# Patient Record
Sex: Male | Born: 1955
Health system: Southern US, Community
[De-identification: ages and names within clinical notes are randomized; demographics above are authoritative.]

## PROBLEM LIST (undated history)

## (undated) ENCOUNTER — Emergency Department (HOSPITAL_BASED_OUTPATIENT_CLINIC_OR_DEPARTMENT_OTHER): Discharge status: Against Medical Advice | Payer: 59

## (undated) DIAGNOSIS — B019 Varicella without complication: Secondary | ICD-10-CM

## (undated) DIAGNOSIS — K5090 Crohn's disease, unspecified, without complications: Secondary | ICD-10-CM

## (undated) DIAGNOSIS — T8859XA Other complications of anesthesia, initial encounter: Secondary | ICD-10-CM

## (undated) DIAGNOSIS — M7551 Bursitis of right shoulder: Secondary | ICD-10-CM

## (undated) DIAGNOSIS — M1990 Unspecified osteoarthritis, unspecified site: Secondary | ICD-10-CM

## (undated) DIAGNOSIS — I341 Nonrheumatic mitral (valve) prolapse: Secondary | ICD-10-CM

## (undated) DIAGNOSIS — L309 Dermatitis, unspecified: Secondary | ICD-10-CM

## (undated) DIAGNOSIS — M545 Low back pain, unspecified: Secondary | ICD-10-CM

## (undated) DIAGNOSIS — G8929 Other chronic pain: Secondary | ICD-10-CM

## (undated) DIAGNOSIS — T4145XA Adverse effect of unspecified anesthetic, initial encounter: Secondary | ICD-10-CM

## (undated) DIAGNOSIS — N200 Calculus of kidney: Secondary | ICD-10-CM

## (undated) DIAGNOSIS — K523 Indeterminate colitis: Secondary | ICD-10-CM

## (undated) DIAGNOSIS — R011 Cardiac murmur, unspecified: Secondary | ICD-10-CM

## (undated) DIAGNOSIS — G43909 Migraine, unspecified, not intractable, without status migrainosus: Secondary | ICD-10-CM

## (undated) DIAGNOSIS — M792 Neuralgia and neuritis, unspecified: Secondary | ICD-10-CM

## (undated) HISTORY — DX: Dermatitis, unspecified: L30.9

## (undated) HISTORY — PX: TONSILECTOMY, ADENOIDECTOMY, BILATERAL MYRINGOTOMY AND TUBES: SHX2538

## (undated) HISTORY — DX: Nonrheumatic mitral (valve) prolapse: I34.1

## (undated) HISTORY — DX: Neuralgia and neuritis, unspecified: M79.2

## (undated) HISTORY — DX: Migraine, unspecified, not intractable, without status migrainosus: G43.909

## (undated) HISTORY — DX: Cardiac murmur, unspecified: R01.1

## (undated) HISTORY — DX: Crohn's disease, unspecified, without complications: K50.90

## (undated) HISTORY — DX: Low back pain, unspecified: M54.50

## (undated) HISTORY — DX: Other chronic pain: G89.29

## (undated) HISTORY — DX: Low back pain: M54.5

## (undated) HISTORY — DX: Varicella without complication: B01.9

## (undated) HISTORY — DX: Indeterminate colitis: K52.3

## (undated) HISTORY — PX: NO PAST SURGERIES: SHX2092

## (undated) HISTORY — PX: APPENDECTOMY: SHX54

## (undated) HISTORY — PX: BELPHAROPTOSIS REPAIR: SHX369

## (undated) HISTORY — DX: Calculus of kidney: N20.0

## (undated) HISTORY — PX: LUMBAR DISC SURGERY: SHX700

## (undated) HISTORY — DX: Bursitis of right shoulder: M75.51

## (undated) HISTORY — PX: SHOULDER SURGERY: SHX246

---

## 2011-12-11 ENCOUNTER — Ambulatory Visit (INDEPENDENT_AMBULATORY_CARE_PROVIDER_SITE_OTHER): Payer: 59 | Admitting: Internal Medicine

## 2011-12-11 ENCOUNTER — Encounter: Payer: Self-pay | Admitting: Internal Medicine

## 2011-12-11 VITALS — BP 110/70 | HR 68 | Temp 98.2°F | Resp 14 | Ht 74.0 in | Wt 148.0 lb

## 2011-12-11 DIAGNOSIS — I341 Nonrheumatic mitral (valve) prolapse: Secondary | ICD-10-CM | POA: Insufficient documentation

## 2011-12-11 DIAGNOSIS — I059 Rheumatic mitral valve disease, unspecified: Secondary | ICD-10-CM

## 2011-12-11 DIAGNOSIS — M67919 Unspecified disorder of synovium and tendon, unspecified shoulder: Secondary | ICD-10-CM

## 2011-12-11 DIAGNOSIS — Z0000 Encounter for general adult medical examination without abnormal findings: Secondary | ICD-10-CM

## 2011-12-11 DIAGNOSIS — M7551 Bursitis of right shoulder: Secondary | ICD-10-CM

## 2011-12-11 MED ORDER — METHYLPREDNISOLONE ACETATE 40 MG/ML IJ SUSP: 40.0000 mg | Freq: Once | INTRAMUSCULAR | Status: DC

## 2011-12-11 NOTE — Patient Instructions (Signed)
Back Exercises Back exercises help treat and prevent back injuries. The goal of back exercises is to increase the strength of your abdominal and back muscles and the flexibility of your back. These exercises should be started when you no longer have back pain. Back exercises include:  Pelvic Tilt. Lie on your back with your knees bent. Tilt your pelvis until the lower part of your back is against the floor. Hold this position 5 to 10 sec and repeat 5 to 10 times.   Knee to Chest. Pull first 1 knee up against your chest and hold for 20 to 30 seconds, repeat this with the other knee, and then both knees. This may be done with the other leg straight or bent, whichever feels better.   Sit-Ups or Curl-Ups. Bend your knees 90 degrees. Start with tilting your pelvis, and do a partial, slow sit-up, lifting your trunk only 30 to 45 degrees off the floor. Take at least 2 to 3 seconds for each sit-up. Do not do sit-ups with your knees out straight. If partial sit-ups are difficult, simply do the above but with only tightening your abdominal muscles and holding it as directed.   Hip-Lift. Lie on your back with your knees flexed 90 degrees. Push down with your feet and shoulders as you raise your hips a couple inches off the floor; hold for 10 seconds, repeat 5 to 10 times.   Back arches. Lie on your stomach, propping yourself up on bent elbows. Slowly press on your hands, causing an arch in your low back. Repeat 3 to 5 times. Any initial stiffness and discomfort should lessen with repetition over time.   Shoulder-Lifts. Lie face down with arms beside your body. Keep hips and torso pressed to floor as you slowly lift your head and shoulders off the floor.  Do not overdo your exercises, especially in the beginning. Exercises may cause you some mild back discomfort which lasts for a few minutes; however, if the pain is more severe, or lasts for more than 15 minutes, do not continue exercises until you see your  caregiver. Improvement with exercise therapy for back problems is slow.  See your caregivers for assistance with developing a proper back exercise program. Document Released: 08/29/2004 Document Revised: 07/11/2011 Document Reviewed: 07/22/2005 [employer] Patient Information 2012 [employer]. 

## 2011-12-11 NOTE — Progress Notes (Signed)
Subjective:    Patient ID: Jared Gilbert, male    DOB: Sep 09, 1955, 56 y.o.   MRN: [id number]  HPI  This is a 56 year old white male who presents to establish long-term medical care.  His current medical problems include mitral valve prolapse he has also a history of bursitis in his right shoulder.  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  He has no current health issues he feels well he does not smoke he is on no maintenance medications other than Restasis eyedrops and topical therapy for seborrhea.  He had a health maintenance examination about one year ago  Review of Systems  Constitutional: Negative for fever and fatigue.  HENT: Negative for hearing loss, congestion, neck pain and postnasal drip.   Eyes: Negative for discharge, redness and visual disturbance.  Respiratory: Negative for cough, shortness of breath and wheezing.   Cardiovascular: Negative for leg swelling.  Gastrointestinal: Negative for abdominal pain, constipation and abdominal distention.  Genitourinary: Negative for urgency and frequency.  Musculoskeletal: Negative for joint swelling and arthralgias.  Skin: Negative for color change and rash.  Neurological: Negative for weakness and light-headedness.  Hematological: Negative for adenopathy.  Psychiatric/Behavioral: Negative for behavioral problems.   Past Medical History  Diagnosis Date  . Blood in stool   . Chicken pox   . GERD (gastroesophageal reflux disease)   . Heart murmur   . Kidney stones   . Migraines   . Eczema   . Blepharitis   . MVP (mitral valve prolapse)     on echo about 10 years ago  2002  . Shoulder bursitis, right     chronic  . Neuritis of upper extremity     right upper extremities  . Chronic lumbar pain     History   Social History  . Marital Status: Married    Spouse Name: N/A    Number of Children: N/A  . Years of Education: N/A   Occupational History  . md     Social History Main Topics  . Smoking status: Never Smoker   . Smokeless tobacco: Not on file  . Alcohol Use: Yes  . Drug Use: No  . Sexually Active: Yes   Other Topics Concern  . Not on file   Social History Narrative  . No narrative on file    Past Surgical History  Procedure Date  . Appendectomy   . Tonsilectomy, adenoidectomy, bilateral myringotomy and tubes   . Belpharoptosis repair     Family History  Problem Relation Age of Onset  . Hyperlipidemia Mother   . Hypertension Mother   . Stroke Father   . Atrial fibrillation Father     Allergies  Allergen Reactions  . Contrast Media (Iodinated Diagnostic Agents)     As a child has reaction    No current outpatient prescriptions on file prior to visit.    BP 110/70  Pulse 68  Temp 98.2 F (36.8 C)  Resp 14  Wt 148 lb (67.132 kg)       Objective:   Physical Exam  Nursing note and vitals reviewed. Constitutional: He appears well-developed and well-nourished.  HENT:  Head: Normocephalic and atraumatic.  Eyes: Conjunctivae are normal. Pupils are equal, round, and reactive to light.  Neck: Normal range of motion. Neck supple.  Cardiovascular: Normal rate and regular rhythm.   Murmur heard. Pulmonary/Chest: Effort normal and breath sounds normal.  Abdominal: Soft. Bowel sounds are normal.  Assessment & Plan:   Informed consent obtained and the patient's right shoulder was prepped with betadine. Local anesthesia was obtained with topical spray. Then 40 mg of Depo-Medrol and 1/2 cc of lidocaine was injected into the joint space. The patient tolerated the procedure without complications. Post injection care discussed with patient.  Patient was set up for complete physical examination.  Patient's mitral valve prolapse is stable and I would not order an echocardiogram at this time unless his changes no murmur or there is significant symptoms of reflux  Otherwise recommended complete physical  examination aspirin a day Kidney exercise

## 2012-03-06 ENCOUNTER — Other Ambulatory Visit (INDEPENDENT_AMBULATORY_CARE_PROVIDER_SITE_OTHER): Payer: 59

## 2012-03-06 DIAGNOSIS — Z0000 Encounter for general adult medical examination without abnormal findings: Secondary | ICD-10-CM

## 2012-03-06 LAB — HEPATIC FUNCTION PANEL
ALT: 18 U/L (ref 0–53)
AST: 24 U/L (ref 0–37)
Albumin: 3.9 g/dL (ref 3.5–5.2)

## 2012-03-06 LAB — CBC WITH DIFFERENTIAL/PLATELET
Basophils Relative: 0.6 % (ref 0.0–3.0)
Eosinophils Relative: 1.9 % (ref 0.0–5.0)
HCT: 40.1 % (ref 39.0–52.0)
Hemoglobin: 13.1 g/dL (ref 13.0–17.0)
Lymphs Abs: 1.4 10*3/uL (ref 0.7–4.0)
MCV: 97.5 fl (ref 78.0–100.0)
Monocytes Absolute: 0.5 10*3/uL (ref 0.1–1.0)
Neutro Abs: 4 10*3/uL (ref 1.4–7.7)
RBC: 4.11 Mil/uL — ABNORMAL LOW (ref 4.22–5.81)
WBC: 5.9 10*3/uL (ref 4.5–10.5)

## 2012-03-06 LAB — POCT URINALYSIS DIPSTICK
Glucose, UA: NEGATIVE
Ketones, UA: NEGATIVE
Spec Grav, UA: >=1.03
Urobilinogen, UA: 0.2

## 2012-03-06 LAB — BASIC METABOLIC PANEL
BUN: 14 mg/dL (ref 6–23)
Chloride: 105 mEq/L (ref 96–112)
Creatinine, Ser: 0.9 mg/dL (ref 0.4–1.5)
GFR: 89.69 mL/min (ref >60.00)
Glucose, Bld: 88 mg/dL (ref 70–99)
Potassium: 4.1 mEq/L (ref 3.5–5.1)

## 2012-03-06 LAB — PSA: PSA: 1.5 ng/mL (ref 0.10–4.00)

## 2012-03-06 LAB — LIPID PANEL: Cholesterol: 159 mg/dL (ref 0–200)

## 2012-03-16 ENCOUNTER — Encounter: Payer: Self-pay | Admitting: Internal Medicine

## 2012-03-16 ENCOUNTER — Ambulatory Visit (INDEPENDENT_AMBULATORY_CARE_PROVIDER_SITE_OTHER): Payer: 59 | Admitting: Internal Medicine

## 2012-03-16 VITALS — BP 104/72 | HR 63 | Temp 97.9°F | Resp 14 | Ht 74.0 in | Wt 150.0 lb

## 2012-03-16 DIAGNOSIS — D229 Melanocytic nevi, unspecified: Secondary | ICD-10-CM

## 2012-03-16 DIAGNOSIS — D239 Other benign neoplasm of skin, unspecified: Secondary | ICD-10-CM

## 2012-03-16 DIAGNOSIS — M7551 Bursitis of right shoulder: Secondary | ICD-10-CM

## 2012-03-16 DIAGNOSIS — Z0000 Encounter for general adult medical examination without abnormal findings: Secondary | ICD-10-CM

## 2012-03-16 DIAGNOSIS — M719 Bursopathy, unspecified: Secondary | ICD-10-CM

## 2012-03-16 MED ORDER — METHYLPREDNISOLONE ACETATE 40 MG/ML IJ SUSP: 40.0000 mg | Freq: Once | INTRAMUSCULAR | Status: DC

## 2012-03-16 NOTE — Addendum Note (Signed)
Addended by: Willy Eddy on: 03/16/2012 05:42 PM   Modules accepted: Orders

## 2012-03-16 NOTE — Progress Notes (Signed)
Subjective:    Patient ID: Jared Gilbert, male    DOB: Apr 29, 1956, 56 y.o.   MRN: [id number]  HPI    Review of Systems  Constitutional: Negative for fever and fatigue.  HENT: Negative for hearing loss, congestion, neck pain and postnasal drip.   Eyes: Negative for discharge, redness and visual disturbance.  Respiratory: Negative for cough, shortness of breath and wheezing.   Cardiovascular: Negative for leg swelling.  Gastrointestinal: Negative for abdominal pain, constipation and abdominal distention.  Genitourinary: Negative for urgency and frequency.  Musculoskeletal: Negative for joint swelling and arthralgias.  Skin: Negative for color change and rash.       Changing mole on arm  Neurological: Negative for weakness and light-headedness.  Hematological: Negative for adenopathy.  Psychiatric/Behavioral: Negative for behavioral problems.   Past Medical History  Diagnosis Date  . Blood in stool   . Chicken pox   . GERD (gastroesophageal reflux disease)   . Heart murmur   . Kidney stones   . Migraines   . Eczema   . Blepharitis   . MVP (mitral valve prolapse)     on echo about 10 years ago  2002  . Shoulder bursitis, right     chronic  . Neuritis of upper extremity     right upper extremities  . Chronic lumbar pain     History   Social History  . Marital Status: Married    Spouse Name: N/A    Number of Children: N/A  . 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 Smoking status: Never Smoker   . Smokeless tobacco: Not on file  . Alcohol Use: Yes  . Drug Use: No  . Sexually Active: Yes   Other Topics Concern  . Not on file   Social History Narrative  . No narrative on file    Past Surgical History  Procedure Date  . Appendectomy   . Tonsilectomy, adenoidectomy, bilateral myringotomy and tubes   . Belpharoptosis repair     Family History  Problem Relation Age of Onset  . Hyperlipidemia Mother   .  Hypertension Mother   . Stroke Father   . Atrial fibrillation Father     Allergies  Allergen Reactions  . Contrast Media (Iodinated Diagnostic Agents)     As a child has reaction    Current Outpatient Prescriptions on File Prior to Visit  Medication Sig Dispense Refill  . cycloSPORINE (RESTASIS) 0.05 % ophthalmic emulsion 1 drop daily.      Marland Kitchen ketoconazole (NIZORAL) 2 % cream Apply topically 2 (two) times daily.      Marland Kitchen ketoconazole (NIZORAL) 2 % shampoo Apply topically once a week.       Current Facility-Administered Medications on File Prior to Visit  Medication Dose Route Frequency Provider Last Rate Last Dose  . methylPREDNISolone acetate (DEPO-MEDROL) injection 40 mg  40 mg Intra-articular Once Stacie Glaze, MD        BP 104/72  Pulse 63  Temp 97.9 F (36.6 C)  Resp 14  Ht 6\' 2"  (1.88 m)  Wt 150 lb (68.04 kg)  BMI 19.26 kg/m2       Objective:   Physical Exam  Constitutional: He is oriented to person, place, and time. He appears well-developed and well-nourished.  HENT:  Head: Normocephalic and atraumatic.  Eyes: Conjunctivae are normal. Pupils are equal, round, and reactive to light.  Neck: Normal range of motion. Neck  supple.  Cardiovascular: Normal rate and regular rhythm.   Pulmonary/Chest: Effort normal and breath sounds normal.  Abdominal: Soft. Bowel sounds are normal.  Genitourinary: Rectum normal and prostate normal.  Musculoskeletal: He exhibits tenderness.       Significantly range limitation of the right shoulder  Neurological: He is alert and oriented to person, place, and time.  Skin: Skin is warm and dry.       Suspicious mole seen on left forearm          Assessment & Plan:   Patient presents for yearly preventative medicine examination.   all immunizations and health maintenance protocols were reviewed with the patient and they are up to date with these protocols.   screening laboratory values were reviewed with the patient including  screening of hyperlipidemia PSA renal function and hepatic function.   There medications past medical history social history problem list and allergies were reviewed in detail.   Goals were established with regard to weight loss exercise diet in compliance with medications   Informed consent was given by the patient for a shave biopsy. The site was prepped with Betadine and using a 15 blade a 1 cm shave biopsy was obtained. The specimin was placed in preservative and sent for pathology. Hemostasis was achieved with a compression. Wound care was discussed with the patient. The patient was informed that it would be one to 2 weeks before the pathology will be interpreted. The lesion was seen on the left anterior shoulder   Informed consent obtained and the patient's right shoulder was prepped with betadine. Local anesthesia was obtained with topical spray. Then 40 mg of Depo-Medrol and 1/2 cc of lidocaine was injected into the joint space. The patient tolerated the procedure without complications. Post injection care discussed with patient.

## 2012-03-16 NOTE — Patient Instructions (Signed)
You have received a steroid injection into a joint space. It will take up to 48 hours before you notice a difference in the pain in the joint. For the next few hours keep ice on the site of the injection. Do not exert the injected joint for the next 24 hours.  

## 2012-05-15 ENCOUNTER — Telehealth: Payer: Self-pay | Admitting: Internal Medicine

## 2012-05-15 MED ORDER — CYCLOSPORINE 0.05 % OP EMUL: 1.0000 [drp] | Freq: Every day | OPHTHALMIC | Status: DC

## 2012-05-15 NOTE — Telephone Encounter (Signed)
done

## 2012-05-15 NOTE — Telephone Encounter (Signed)
Pt called and is req to get a renewal on script for Restasis. Pt said that he was prescribed this med when he was in [place]. Pls call in to [facility] at [facility].

## 2012-06-25 ENCOUNTER — Encounter (INDEPENDENT_AMBULATORY_CARE_PROVIDER_SITE_OTHER): Payer: 59 | Admitting: Ophthalmology

## 2012-06-25 DIAGNOSIS — H43819 Vitreous degeneration, unspecified eye: Secondary | ICD-10-CM

## 2012-06-25 DIAGNOSIS — H2510 Age-related nuclear cataract, unspecified eye: Secondary | ICD-10-CM

## 2012-06-25 DIAGNOSIS — H16049 Marginal corneal ulcer, unspecified eye: Secondary | ICD-10-CM

## 2012-06-28 ENCOUNTER — Observation Stay (HOSPITAL_COMMUNITY)
Admission: AD | Admit: 2012-06-28 | Discharge: 2012-06-29 | Disposition: A | Discharge status: Home / Self Care | Payer: 59 | Source: Ambulatory Visit | Attending: Gastroenterology | Admitting: Gastroenterology

## 2012-06-28 ENCOUNTER — Encounter (HOSPITAL_COMMUNITY): Payer: Self-pay

## 2012-06-28 ENCOUNTER — Telehealth: Payer: Self-pay | Admitting: Gastroenterology

## 2012-06-28 DIAGNOSIS — K5730 Diverticulosis of large intestine without perforation or abscess without bleeding: Secondary | ICD-10-CM | POA: Insufficient documentation

## 2012-06-28 DIAGNOSIS — K921 Melena: Principal | ICD-10-CM | POA: Insufficient documentation

## 2012-06-28 DIAGNOSIS — Z7982 Long term (current) use of aspirin: Secondary | ICD-10-CM | POA: Insufficient documentation

## 2012-06-28 DIAGNOSIS — K5289 Other specified noninfective gastroenteritis and colitis: Secondary | ICD-10-CM | POA: Insufficient documentation

## 2012-06-28 DIAGNOSIS — K219 Gastro-esophageal reflux disease without esophagitis: Secondary | ICD-10-CM | POA: Insufficient documentation

## 2012-06-28 DIAGNOSIS — K922 Gastrointestinal hemorrhage, unspecified: Secondary | ICD-10-CM | POA: Diagnosis present

## 2012-06-28 DIAGNOSIS — K529 Noninfective gastroenteritis and colitis, unspecified: Secondary | ICD-10-CM

## 2012-06-28 HISTORY — DX: Other complications of anesthesia, initial encounter: T88.59XA

## 2012-06-28 HISTORY — DX: Adverse effect of unspecified anesthetic, initial encounter: T41.45XA

## 2012-06-28 LAB — BASIC METABOLIC PANEL
BUN: 20 mg/dL (ref 6–23)
CO2: 30 mEq/L (ref 19–32)
Chloride: 101 mEq/L (ref 96–112)
GFR calc non Af Amer: >90 mL/min (ref >90)
Glucose, Bld: 90 mg/dL (ref 70–99)
Potassium: 4.7 mEq/L (ref 3.5–5.1)
Sodium: 137 mEq/L (ref 135–145)

## 2012-06-28 LAB — CBC
HCT: 34.6 % — ABNORMAL LOW (ref 39.0–52.0)
Hemoglobin: 11.6 g/dL — ABNORMAL LOW (ref 13.0–17.0)
MCH: 31.7 pg (ref 26.0–34.0)
MCHC: 33.5 g/dL (ref 30.0–36.0)
MCV: 94.5 fL (ref 78.0–100.0)
RBC: 3.66 MIL/uL — ABNORMAL LOW (ref 4.22–5.81)

## 2012-06-28 LAB — HEMOGLOBIN AND HEMATOCRIT, BLOOD
HCT: 31.2 % — ABNORMAL LOW (ref 39.0–52.0)
Hemoglobin: 10.5 g/dL — ABNORMAL LOW (ref 13.0–17.0)

## 2012-06-28 LAB — PROTIME-INR: Prothrombin Time: 13.3 seconds (ref 11.6–15.2)

## 2012-06-28 MED ORDER — PEG-KCL-NACL-NASULF-NA ASC-C 100 G PO SOLR
1.0000 | Freq: Once | ORAL | Status: AC
  Administered 2012-06-28: 100 g via ORAL
  Filled 2012-06-28: qty 1

## 2012-06-28 MED ORDER — KCL IN DEXTROSE-NACL 20-5-0.45 MEQ/L-%-% IV SOLN
INTRAVENOUS | Status: DC
  Administered 2012-06-28: 75 mL/h via INTRAVENOUS
  Filled 2012-06-28 (×3): qty 1000

## 2012-06-28 MED ORDER — PEG-KCL-NACL-NASULF-NA ASC-C 100 G PO SOLR
1.0000 | Freq: Once | ORAL | Status: AC
  Administered 2012-06-29: 100 g via ORAL

## 2012-06-28 MED ORDER — POLYETHYLENE GLYCOL 3350 17 GM/SCOOP PO POWD: 1.0000 | Freq: Once | ORAL | Status: DC

## 2012-06-28 NOTE — Telephone Encounter (Signed)
Painless red rectal bleeding started this AM.  Otherwise feels well, three episodes.  Tells me he has known diverticulosis.    Planning for direct admit for observation at [facility], he is aware.

## 2012-06-28 NOTE — Plan of Care (Signed)
Problem: Consults Goal: Nutrition Consult-if indicated Outcome: Progressing Admission assessment nutrition score triggered nutrition consult.  Problem: Phase II Progression Outcomes Goal: No active bleeding Outcome: Progressing No bowel movements since 1030 am admission. Goal: H&H stablized < 1gm drop in 24 hrs Outcome: Progressing Second H&H draw just completed.  Await results. Goal: Tolerating diet Outcome: Progressing Clear liquids, NPO after midnight.

## 2012-06-28 NOTE — H&P (Signed)
[facility] Primary Care Physician:  Carrie Mew, MD Primary Gastroenterologist:  None  CC: Lower GI bleeding  HPI:  Jared Gilbert is a 56 y.o. male with several hours of BRBPR, painless.  He's never had this before. Woke up at 2am with urge to move bowels and it was stool mixed with red blood.  Since then 3 more episodes, the last was 9am.  No NSAID except 3 times a week asa, no blood thinner.    No chest pain, no sob, no orthostatic symptoms   Past Medical History  Diagnosis Date  . Blood in stool   . Chicken pox   . GERD (gastroesophageal reflux disease)   . Heart murmur   . Kidney stones   . Migraines   . Eczema   . Blepharitis   . MVP (mitral valve prolapse)     on echo about 10 years ago  2002  . Shoulder bursitis, right     chronic  . Neuritis of upper extremity     right upper extremities  . Chronic lumbar pain   . Complication of anesthesia     Past Surgical History  Procedure Date  . Appendectomy   . Tonsilectomy, adenoidectomy, bilateral myringotomy and tubes   . Belpharoptosis repair     Prior to Admission medications   Medication Sig Start Date End Date Taking? Authorizing Provider  aspirin 81 MG tablet Take 81 mg by mouth daily.   Yes Historical Provider, MD  cycloSPORINE (RESTASIS) 0.05 % ophthalmic emulsion Apply 1 drop to eye daily. 05/15/12  Yes Stacie Glaze, MD  gentamicin (GARAMYCIN) 0.3 % ophthalmic solution Place 1 drop into the left eye 4 (four) times daily. x2 weeks starting 06/24/2012   Yes Historical Provider, MD  ketoconazole (NIZORAL) 2 % cream Apply topically 2 (two) times daily.   Yes Historical Provider, MD  ketoconazole (NIZORAL) 2 % shampoo Apply topically once a week.   Yes Historical Provider, MD  loteprednol (LOTEMAX) 0.5 % ophthalmic suspension Place 1 drop into both eyes 2 (two) times daily. X 1 week (starting 06/24/12)   Yes Historical Provider, MD    Current Facility-Administered Medications  Medication  Dose Route Frequency Provider Last Rate Last Dose  . dextrose 5 % and 0.45 % NaCl with KCl 20 mEq/L infusion   Intravenous Continuous Rachael Fee, MD 75 mL/hr at 06/28/12 1141 75 mL/hr at 06/28/12 1141    Allergies as of 06/28/2012 - Review Complete 06/28/2012  Allergen Reaction Noted  . Contrast media (iodinated diagnostic agents)  12/11/2011    Family History  Problem Relation Age of Onset  . Hyperlipidemia Mother   . Hypertension Mother   . Stroke Father   . Atrial fibrillation Father     History   Social History  . Marital Status: Married    Spouse Name: N/A    Number of Children: N/A  . 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 Smoking status: Never Smoker   . Smokeless tobacco: Not on file  . Alcohol Use: 0.0 oz/week    5-10 Glasses of wine per week  . Drug Use: No  . Sexually Active: Yes   Other Topics Concern  . Not on file   Social History Narrative  . No narrative on file     Review of Systems: Pertinent positive and negative review of systems were noted in the above HPI section. Complete review of systems  was performed and was otherwise normal.   Physical Exam: Vital signs in last 24 hours: Temp:  [97.7 F (36.5 C)-98.1 F (36.7 C)] 98.1 F (36.7 C) (11/24 1047) Pulse Rate:  [63-87] 87  (11/24 1049) Resp:  [18] 18  (11/24 1049) BP: (117-124)/(79-85) 117/85 mmHg (11/24 1049) SpO2:  [97 %-100 %] 97 % (11/24 1049) Weight:  [141 lb 5 oz (64.1 kg)] 141 lb 5 oz (64.1 kg) (11/24 0900) Last BM Date: 06/28/12 (4 Stools today.  Maroon colored per patient.) Constitutional: generally well-appearing Psychiatric: alert and oriented x3 Eyes: extraocular movements intact Mouth: oral pharynx moist, no lesions Neck: supple no lymphadenopathy Cardiovascular: heart regular rate and rhythm Lungs: clear to auscultation bilaterally Abdomen: soft, nontender, nondistended, no obvious ascites, no peritoneal signs,  normal bowel sounds Extremities: no lower extremity edema bilaterally Skin: no lesions on visible extremities   Lab Results:  Basename 06/28/12 1136  WBC 6.6  HGB 11.6*  HCT 34.6*  PLT 193  MCV 94.5   BMET  Basename 06/28/12 1136  NA 137  K 4.7  CL 101  CO2 30  GLUCOSE 90  BUN 20  CREATININE 0.78  CALCIUM 9.1   LFT No results found for this basename: PROT,ALBUMIN,AST,ALT,ALKPHOS,BILITOT,BILIDIR,IBILI in the last 72 hours PT/INR  Basename 06/28/12 1136  LABPROT 13.3  INR 1.02     Impression/Plan: 56 y.o. male  With painless rectal bleeding  Likely diverticular.  Pretty low volume so far.  HE is admitted for observation and will prep for colonoscopy for tomorrow AM with Dr. Rhea Belton.    Rob Bunting  06/28/2012, 1:09 PM [facility] Pager [phone]

## 2012-06-29 ENCOUNTER — Encounter (HOSPITAL_COMMUNITY)
Admission: AD | Disposition: A | Discharge status: Home / Self Care | Payer: Self-pay | Source: Ambulatory Visit | Attending: Gastroenterology

## 2012-06-29 ENCOUNTER — Encounter (HOSPITAL_COMMUNITY): Payer: Self-pay

## 2012-06-29 DIAGNOSIS — K5289 Other specified noninfective gastroenteritis and colitis: Secondary | ICD-10-CM

## 2012-06-29 DIAGNOSIS — K529 Noninfective gastroenteritis and colitis, unspecified: Secondary | ICD-10-CM

## 2012-06-29 HISTORY — PX: COLONOSCOPY: SHX5424

## 2012-06-29 LAB — CBC
Hemoglobin: 11.5 g/dL — ABNORMAL LOW (ref 13.0–17.0)
MCHC: 33.3 g/dL (ref 30.0–36.0)
WBC: 6.5 10*3/uL (ref 4.0–10.5)

## 2012-06-29 SURGERY — COLONOSCOPY
Anesthesia: Moderate Sedation

## 2012-06-29 MED ORDER — FENTANYL CITRATE 0.05 MG/ML IJ SOLN
INTRAMUSCULAR | Status: DC | PRN
  Administered 2012-06-29 (×4): 25 ug via INTRAVENOUS

## 2012-06-29 MED ORDER — FENTANYL CITRATE 0.05 MG/ML IJ SOLN
INTRAMUSCULAR | Status: AC
  Filled 2012-06-29: qty 4

## 2012-06-29 MED ORDER — MIDAZOLAM HCL 5 MG/5ML IJ SOLN
INTRAMUSCULAR | Status: DC | PRN
  Administered 2012-06-29 (×3): 2 mg via INTRAVENOUS

## 2012-06-29 MED ORDER — DIPHENHYDRAMINE HCL 50 MG/ML IJ SOLN
INTRAMUSCULAR | Status: AC
  Filled 2012-06-29: qty 1

## 2012-06-29 MED ORDER — SODIUM CHLORIDE 0.9 % IV SOLN
Freq: Once | INTRAVENOUS | Status: AC
  Administered 2012-06-29: 500 mL via INTRAVENOUS

## 2012-06-29 MED ORDER — MIDAZOLAM HCL 10 MG/2ML IJ SOLN
INTRAMUSCULAR | Status: AC
  Filled 2012-06-29: qty 4

## 2012-06-29 MED ORDER — SODIUM CHLORIDE 0.9 % IV SOLN: INTRAVENOUS | Status: DC

## 2012-06-29 NOTE — Interval H&P Note (Signed)
History and Physical Interval Note: No further bleeding and no pain. Completed prep without incident. Proceeding to colonoscopy, discussed with patient.  06/29/2012 8:37 AM  Jared Gilbert  has presented today for surgery, with the diagnosis of lower gi bleeding  The various methods of treatment have been discussed with the patient and family. After consideration of risks, benefits and other options for treatment, the patient has consented to  Procedure(s) (LRB) with comments: COLONOSCOPY (N/A) as a surgical intervention .  The patient's history has been reviewed, patient examined, no change in status, stable for surgery.  I have reviewed the patient's chart and labs.  Questions were answered to the patient's satisfaction.     Lyndall Windt M

## 2012-06-29 NOTE — Progress Notes (Signed)
Discharge instructions given to pt, verbalized understanding. Left the unit in stable condition. 

## 2012-06-29 NOTE — Op Note (Signed)
[facility] [address] [place], [address]   COLONOSCOPY PROCEDURE REPORT  PATIENT: Everitt, Wenner  MR#: [id number] BIRTHDATE: May 19, 1956 , 56  yrs. old GENDER: Male ENDOSCOPIST: Beverley Fiedler, MD REFERRED BY:[facility] PROCEDURE DATE:  06/29/2012 PROCEDURE:   Colonoscopy with biopsy ASA CLASS:   Class II INDICATIONS:hematochezia. MEDICATIONS: These medications were titrated to patient response per physician's verbal order, Fentanyl 100 mcg IV, and Versed 6 mg IV  DESCRIPTION OF PROCEDURE:   After the risks benefits and alternatives of the procedure were thoroughly explained, informed consent was obtained.  A digital rectal exam revealed no rectal mass.   The Pentax Ped Colon [id number]  endoscope was introduced through the anus and advanced to the terminal ileum which was intubated for a short distance. No adverse events experienced. The quality of the prep was good, using MoviPrep  The instrument was then slowly withdrawn as the colon was fully examined.    COLON FINDINGS: The mucosa appeared normal in the terminal ileum. Abnormal mucosa was found at the cecum, in the ascending colon, transverse colon, and descending colon.  The mucosa demonstrate patchy areas of ulceration, erosions with increased granularity. the areas of inflammation became more patchy in the descending colon, with normal appearing mucosa in the sigmoid and rectum. Multiple biopsies of the area were performed using cold forceps (jar 1 = right colon, jar 2 = left colon, jar 3 = rectum).   Very mild diverticulosis was noted in the sigmoid colon.  Retroflexed views revealed no abnormalities.      The scope was withdrawn and the procedure completed.  COMPLICATIONS: There were no complications.    ENDOSCOPIC IMPRESSION: 1.   Normal mucosa in the terminal ileum 2.   Mild colitis was found at the cecum, in the ascending colon, transverse colon, and descending colon; multiple biopsies of  the area were performed using cold forceps 3.   Normal mucosa in the sigmoid colon and rectum; multiple biopsies of the area were performed using cold forceps 4.   Mild diverticulosis was noted in the sigmoid colon  RECOMMENDATIONS: 1.  Await pathology results.  Further recommendations based on pathology results 2.  Anticipate discharge later today   eSigned:  Beverley Fiedler, MD 06/29/2012 9:28 AM   cc: Stacie Glaze, MD and The Patient   PATIENT NAME:  Trenell, Concannon MR#: [id number]

## 2012-06-29 NOTE — Discharge Summary (Signed)
[facility] Discharge Summary  Name: Jared Gilbert MRN: [id number] DOB: 03/17/1956 56 y.o. PCP:  Carrie Mew, MD  Date of Admission: 06/28/2012 10:06 AM Date of Discharge: 06/29/2012 Primary Gastroenterologist: Rob Bunting, MD (new) Discharging Physician: Erick Blinks, MD  Discharge Diagnosis: Colitis. Stable at discharge  Consultations:none  Procedures Performed:  No results found.  GI Procedures:  Colonoscopy with biopsies by Erick Blinks, MD  History/Physical Exam:  See Admission H&P  Admission HPI: Per Jared Papa, MD  Jared Gilbert is a 56 y.o. male with several hours of BRBPR, painless. He's never had this before. Woke up at 2am with urge to move bowels and it was stool mixed with red blood. Since then 3 more episodes, the last was 9am. No NSAID except 3 times a week asa, no blood thinner.  No chest pain, no sob, no orthostatic symptoms   Hospital Course by problem list: Colitis.  Patient was a direct admit by our office for evaluation of acute painless hematochezia 06/28/12. He was admitted to observation, started on clear liquids and prepped for colonoscopy to be done the following morning. Admission CBC was remarkable for hemoglobin of 11.6, down from baseline of 13.1 in August. Colonoscopy revealed normal terminal ileum, mild colitis of cecum, ascending, transverse, and descending colon. Biopsies pending at time of discharge. Following colonoscopy patient was stable for discharge. He was given a follow up appointment with  Dr. Christella Hartigan  Discharge Vitals:  BP 103/65  Pulse 57  Temp 98.5 F (36.9 C) (Oral)  Resp 16  Ht 6\' 2"  (1.88 m)  Wt 141 lb 5 oz (64.1 kg)  BMI 18.14 kg/m2  SpO2 98%  Discharge Labs:  Results for orders placed during the hospital encounter of 06/28/12 (from the past 24 hour(s))  BASIC METABOLIC PANEL     Status: Normal   Collection Time   06/28/12 11:36 AM      Component Value Range   Sodium 137  135 - 145 mEq/L   Potassium 4.7  3.5 - 5.1 mEq/L   Chloride 101  96 - 112 mEq/L   CO2 30  19 - 32 mEq/L   Glucose, Bld 90  70 - 99 mg/dL   BUN 20  6 - 23 mg/dL   Creatinine, Ser 4.09  0.50 - 1.35 mg/dL   Calcium 9.1  8.4 - 81.1 mg/dL   GFR calc non Af Amer >90  >90 mL/min   GFR calc Af Amer >90  >90 mL/min  CBC     Status: Abnormal   Collection Time   06/28/12 11:36 AM      Component Value Range   WBC 6.6  4.0 - 10.5 K/uL   RBC 3.66 (*) 4.22 - 5.81 MIL/uL   Hemoglobin 11.6 (*) 13.0 - 17.0 g/dL   HCT 91.4 (*) 78.2 - 95.6 %   MCV 94.5  78.0 - 100.0 fL   MCH 31.7  26.0 - 34.0 pg   MCHC 33.5  30.0 - 36.0 g/dL   RDW 21.3  08.6 - 57.8 %   Platelets 193  150 - 400 K/uL  PROTIME-INR     Status: Normal   Collection Time   06/28/12 11:36 AM      Component Value Range   Prothrombin Time 13.3  11.6 - 15.2 seconds   INR 1.02  0.00 - 1.49  APTT     Status: Normal   Collection Time   06/28/12 11:36 AM      Component Value Range  aPTT 26  24 - 37 seconds  HEMOGLOBIN AND HEMATOCRIT, BLOOD     Status: Abnormal   Collection Time   06/28/12  6:04 PM      Component Value Range   Hemoglobin 10.5 (*) 13.0 - 17.0 g/dL   HCT 16.1 (*) 09.6 - 04.5 %  CBC     Status: Abnormal   Collection Time   06/29/12  4:50 AM      Component Value Range   WBC 6.5  4.0 - 10.5 K/uL   RBC 3.64 (*) 4.22 - 5.81 MIL/uL   Hemoglobin 11.5 (*) 13.0 - 17.0 g/dL   HCT 40.9 (*) 81.1 - 91.4 %   MCV 94.8  78.0 - 100.0 fL   MCH 31.6  26.0 - 34.0 pg   MCHC 33.3  30.0 - 36.0 g/dL   RDW 78.2  95.6 - 21.3 %   Platelets 202  150 - 400 K/uL    Disposition and follow-up:   [name] was discharged from [facility] in stable condition.    Follow-up Appointments: Discharge Orders    Future Appointments: Provider: Department: Dept Phone: Center:   07/24/2012 2:15 PM Rachael Fee, MD [facility] [phone] [facility]     Future Orders Please Complete By Expires   Discharge instructions       Comments:   Follow a low fiber diet until follow up appointment with Dr. Christella Hartigan.  We will call you with biopsy results   Increase activity slowly      Comments:   Walk with assistance use walker or cane as needed      Discharge Medications:   Medication List     As of 06/29/2012 11:32 AM    TAKE these medications         aspirin 81 MG tablet   Take 81 mg by mouth daily.      cycloSPORINE 0.05 % ophthalmic emulsion   Commonly known as: RESTASIS   Apply 1 drop to eye daily.      gentamicin 0.3 % ophthalmic solution   Commonly known as: GARAMYCIN   Place 1 drop into the left eye 4 (four) times daily. x2 weeks starting 06/24/2012      ketoconazole 2 % cream   Commonly known as: NIZORAL   Apply topically 2 (two) times daily.      ketoconazole 2 % shampoo   Commonly known as: NIZORAL   Apply topically once a week.      ASK your doctor about these medications         loteprednol 0.5 % ophthalmic suspension   Commonly known as: LOTEMAX   Place 1 drop into both eyes 2 (two) times daily. X 1 week (starting 06/24/12)        Signed: Willette Cluster 06/29/2012, 11:32 AM

## 2012-06-30 ENCOUNTER — Encounter (HOSPITAL_COMMUNITY): Payer: Self-pay

## 2012-06-30 ENCOUNTER — Encounter (HOSPITAL_COMMUNITY): Payer: Self-pay | Admitting: Internal Medicine

## 2012-07-01 ENCOUNTER — Telehealth: Payer: Self-pay | Admitting: Gastroenterology

## 2012-07-01 MED ORDER — MESALAMINE 1.2 G PO TBEC: 2400.0000 mg | DELAYED_RELEASE_TABLET | Freq: Every day | ORAL | Status: DC

## 2012-07-01 NOTE — Telephone Encounter (Signed)
asdf

## 2012-07-24 ENCOUNTER — Other Ambulatory Visit (INDEPENDENT_AMBULATORY_CARE_PROVIDER_SITE_OTHER): Payer: 59

## 2012-07-24 ENCOUNTER — Ambulatory Visit (INDEPENDENT_AMBULATORY_CARE_PROVIDER_SITE_OTHER): Payer: 59 | Admitting: Gastroenterology

## 2012-07-24 ENCOUNTER — Encounter: Payer: Self-pay | Admitting: Gastroenterology

## 2012-07-24 VITALS — BP 120/70 | HR 64 | Ht 74.0 in | Wt 150.0 lb

## 2012-07-24 DIAGNOSIS — K523 Indeterminate colitis: Secondary | ICD-10-CM

## 2012-07-24 DIAGNOSIS — K5289 Other specified noninfective gastroenteritis and colitis: Secondary | ICD-10-CM

## 2012-07-24 LAB — CBC WITH DIFFERENTIAL/PLATELET
Basophils Relative: 0.7 % (ref 0.0–3.0)
Eosinophils Relative: 1.4 % (ref 0.0–5.0)
HCT: 36.5 % — ABNORMAL LOW (ref 39.0–52.0)
Hemoglobin: 12 g/dL — ABNORMAL LOW (ref 13.0–17.0)
Lymphs Abs: 1.8 10*3/uL (ref 0.7–4.0)
MCV: 95.6 fl (ref 78.0–100.0)
Monocytes Absolute: 0.6 10*3/uL (ref 0.1–1.0)
Monocytes Relative: 9.8 % (ref 3.0–12.0)
RBC: 3.82 Mil/uL — ABNORMAL LOW (ref 4.22–5.81)
WBC: 6.4 10*3/uL (ref 4.5–10.5)

## 2012-07-24 LAB — COMPREHENSIVE METABOLIC PANEL
Albumin: 4.1 g/dL (ref 3.5–5.2)
Alkaline Phosphatase: 41 U/L (ref 39–117)
BUN: 13 mg/dL (ref 6–23)
CO2: 34 mEq/L — ABNORMAL HIGH (ref 19–32)
GFR: 110 mL/min (ref >60.00)
Glucose, Bld: 59 mg/dL — ABNORMAL LOW (ref 70–99)
Total Bilirubin: 0.8 mg/dL (ref 0.3–1.2)

## 2012-07-24 NOTE — Progress Notes (Signed)
Review of pertinent gastrointestinal problems: 1. Indeterminent Colitis: favoring Crohn's colitis.  Colonoscopy 06/2012 found patchy colitis, predominant right sided. Path proved chronic inflammation.  Terminal ileum and left colon were normal.  Presented with overt GI bleeding.  For 2 years prior to diagnosis he had 3-4 to week long episodes of cramping, loose stools. This may Was started on 2.4gm lialda once daily.   HPI: This is a   very pleasant 56 year old man whom I first met about one month ago when he was hospitalized with acute lower GI bleeding. He underwent colonoscopy. See those results summarized above  Feels ocmpletley normal now.  Perhaps in retrospect he had 3-4 episodes of 2-3 week long bowel troubles. Over course of 2 years. These were asscoiated with cramping, loose stools (2-3 per day).  Between those very regular. Takes 10 advil a year.  Stopped ASA (which he was on 3-4 times per week).    Niece on sisters side (73 yo) has pretty serious UC.   Past Medical History  Diagnosis Date  . Chicken pox   . GERD (gastroesophageal reflux disease)   . Heart murmur   . Kidney stones   . Migraines   . Eczema   . Blepharitis   . MVP (mitral valve prolapse)     on echo about 10 years ago  2002  . Shoulder bursitis, right     chronic  . Neuritis of upper extremity     right upper extremities  . Chronic lumbar pain   . Complication of anesthesia   . Colitis, indeterminate   . Crohn's disease     Past Surgical History  Procedure Date  . Appendectomy   . Tonsilectomy, adenoidectomy, bilateral myringotomy and tubes   . Belpharoptosis repair   . Colonoscopy 06/29/2012    Procedure: COLONOSCOPY;  Surgeon: Beverley Fiedler, MD;  Location: [facility];  Service: Gastroenterology;  Laterality: N/A;    Current Outpatient Prescriptions  Medication Sig Dispense Refill  . cycloSPORINE (RESTASIS) 0.05 % ophthalmic emulsion Apply 1 drop to eye daily.  0.4 mL  6  . ketoconazole  (NIZORAL) 2 % cream Apply topically 2 (two) times daily.      Marland Kitchen ketoconazole (NIZORAL) 2 % shampoo Apply topically once a week.      . mesalamine (LIALDA) 1.2 G EC tablet Take 2 tablets (2.4 g total) by mouth daily with breakfast.  60 tablet  11    Allergies as of 07/24/2012 - Review Complete 07/24/2012  Allergen Reaction Noted  . Contrast media (iodinated diagnostic agents)  12/11/2011    Family History  Problem Relation Age of Onset  . Hyperlipidemia Mother   . Hypertension Mother   . Stroke Father   . Atrial fibrillation Father     History   Social History  . Marital Status: Married    Spouse Name: N/A    Number of Children: 4  . Years of Education: N/A   Occupational History  . md   .  [employer]   Social History Main Topics  . Smoking status: Never Smoker   . Smokeless tobacco: Never Used  . Alcohol Use: 0.0 oz/week    5-10 Glasses of wine per week  . Drug Use: No  . Sexually Active: Yes   Other Topics Concern  . Not on file   Social History Narrative  . No narrative on file      Physical Exam: BP 120/70  Pulse 64  Ht 6\' 2"  (1.88 m)  Wt 150 lb (68.04 kg)  BMI 19.26 kg/m2 Constitutional: generally well-appearing Psychiatric: alert and oriented x3 Abdomen: soft, nontender, nondistended, no obvious ascites, no peritoneal signs, normal bowel sounds     Assessment and plan: 56 y.o. male with indeterminate colitis  We are very good discussion of indeterminate colitis. I favor that he has mild Crohn's colitis. I don't think that knowing for certain whether he has Crohn's colitis versus ulcerative colitis is a very important clinical question at this point.  He asked if taking 1.2 g Lialda daily instead of 2.4 was okay, he had some mild nausea on 2.4 g. I recommended against that it seems quite low dosing. Instead he is going to split dose his lialda taking one pill twice daily. He'll get a basic set of labs today including CBC, complete metabolic profile.  He knows to call me in 3-4 months and sooner if needed.

## 2012-07-24 NOTE — Patient Instructions (Addendum)
You will have labs checked today in the basement lab.  Please head down after you check out with the front desk  (cbc, cmet) Call Dr. Christella Hartigan in 3-4 months (cell [phone]). Avoid excessive NSAIDs, these can exacerbate inflammatory bowel disease. Continue two pills of lialda daily. Resume normal diet.

## 2012-09-19 ENCOUNTER — Other Ambulatory Visit: Payer: Self-pay

## 2012-10-23 ENCOUNTER — Encounter: Payer: Self-pay | Admitting: Internal Medicine

## 2012-10-29 ENCOUNTER — Telehealth: Payer: Self-pay | Admitting: Internal Medicine

## 2012-10-29 NOTE — Telephone Encounter (Signed)
Pt would like Dr Lovell Sheehan to call in his eye drops (pt spoke w/ Dr Lovell Sheehan and he his familiar w/ request) Azasite solution one drop/day ea eye for one week  3 refills Lotamax  5ml bottle  One drop 2 x day ea eye one week   3 refills Dr. Hale Bogus was prescribing MD Pt also needs: cycloSPORINE (RESTASIS) 0.05 % ophthalmic emulsion ketoconazole (NIZORAL) 2 % cream  Pharm:  [facility]/ [address]

## 2012-10-30 ENCOUNTER — Other Ambulatory Visit: Payer: Self-pay | Admitting: *Deleted

## 2012-10-30 MED ORDER — LOTEPREDNOL ETABONATE 0.5 % OP SUSP: OPHTHALMIC | Status: DC

## 2012-10-30 MED ORDER — AZITHROMYCIN 1 % OP SOLN: OPHTHALMIC | Status: DC

## 2012-10-30 MED ORDER — CYCLOSPORINE 0.05 % OP EMUL: 1.0000 [drp] | Freq: Every day | OPHTHALMIC | Status: DC

## 2012-10-30 MED ORDER — KETOCONAZOLE 2 % EX CREA: TOPICAL_CREAM | Freq: Two times a day (BID) | CUTANEOUS | Status: DC

## 2013-03-30 ENCOUNTER — Other Ambulatory Visit (HOSPITAL_COMMUNITY): Payer: Self-pay | Admitting: Orthopedic Surgery

## 2013-03-30 DIAGNOSIS — M25511 Pain in right shoulder: Secondary | ICD-10-CM

## 2013-04-06 ENCOUNTER — Ambulatory Visit (HOSPITAL_COMMUNITY): Payer: 59

## 2013-04-09 ENCOUNTER — Ambulatory Visit (HOSPITAL_COMMUNITY)
Admission: RE | Admit: 2013-04-09 | Discharge: 2013-04-09 | Disposition: A | Discharge status: Home / Self Care | Payer: 59 | Source: Ambulatory Visit | Attending: Orthopedic Surgery | Admitting: Orthopedic Surgery

## 2013-04-09 DIAGNOSIS — M19019 Primary osteoarthritis, unspecified shoulder: Secondary | ICD-10-CM | POA: Insufficient documentation

## 2013-04-09 DIAGNOSIS — M25511 Pain in right shoulder: Secondary | ICD-10-CM

## 2013-06-07 NOTE — Pre-Procedure Instructions (Signed)
Jared Gilbert  06/07/2013   Your procedure is scheduled on:  Thursday, November 13th  Report to Main Entrance "A" and check in with admitting at 0530 AM.  Call this number if you have problems the morning of surgery: [phone]   Remember:   Do not eat food or drink liquids after midnight.   Take these medicines the morning of surgery with A SIP OF WATER: eye drops   Do not wear jewelry.  Do not wear lotions, powders, or perfumes. You may wear deodorant.  Do not shave 48 hours prior to surgery. Men may shave face and neck.  Do not bring valuables to the hospital.  [facility] is not responsible   for any belongings or valuables.               Contacts, dentures or bridgework may not be worn into surgery.  Leave suitcase in the car. After surgery it may be brought to your room.  For patients admitted to the hospital, discharge time is determined by your   treatment team.               Patients discharged the day of surgery will not be allowed to drive home.    Special Instructions: Shower using CHG 2 nights before surgery and the night before surgery.  If you shower the day of surgery use CHG.  Use special wash - you have one bottle of CHG for all showers.  You should use approximately 1/3 of the bottle for each shower.   Please read over the following fact sheets that you were given: Pain Booklet, Coughing and Deep Breathing and Surgical Site Infection Prevention

## 2013-06-08 ENCOUNTER — Encounter (HOSPITAL_COMMUNITY)
Admission: RE | Admit: 2013-06-08 | Discharge: 2013-06-08 | Disposition: A | Discharge status: Home / Self Care | Payer: 59 | Source: Ambulatory Visit | Attending: Orthopedic Surgery | Admitting: Orthopedic Surgery

## 2013-06-08 ENCOUNTER — Encounter (HOSPITAL_COMMUNITY): Payer: Self-pay

## 2013-06-08 DIAGNOSIS — Z01818 Encounter for other preprocedural examination: Secondary | ICD-10-CM | POA: Insufficient documentation

## 2013-06-08 DIAGNOSIS — Z01812 Encounter for preprocedural laboratory examination: Secondary | ICD-10-CM | POA: Insufficient documentation

## 2013-06-08 HISTORY — DX: Unspecified osteoarthritis, unspecified site: M19.90

## 2013-06-08 LAB — BASIC METABOLIC PANEL
CO2: 26 mEq/L (ref 19–32)
Chloride: 102 mEq/L (ref 96–112)
Creatinine, Ser: 0.8 mg/dL (ref 0.50–1.35)
GFR calc Af Amer: >90 mL/min (ref >90)
Potassium: 4.8 mEq/L (ref 3.5–5.1)

## 2013-06-08 LAB — CBC
HCT: 42.2 % (ref 39.0–52.0)
MCV: 95.3 fL (ref 78.0–100.0)
Platelets: 195 10*3/uL (ref 150–400)
RBC: 4.43 MIL/uL (ref 4.22–5.81)
RDW: 13.5 % (ref 11.5–15.5)
WBC: 5.1 10*3/uL (ref 4.0–10.5)

## 2013-06-10 ENCOUNTER — Other Ambulatory Visit: Payer: Self-pay

## 2013-06-16 MED ORDER — CEFAZOLIN SODIUM-DEXTROSE 2-3 GM-% IV SOLR
2.0000 g | INTRAVENOUS | Status: AC
  Administered 2013-06-17: 2 g via INTRAVENOUS
  Filled 2013-06-16: qty 50

## 2013-06-17 ENCOUNTER — Encounter (HOSPITAL_COMMUNITY)
Admission: RE | Disposition: A | Discharge status: Home / Self Care | Payer: Self-pay | Source: Ambulatory Visit | Attending: Orthopedic Surgery

## 2013-06-17 ENCOUNTER — Encounter (HOSPITAL_COMMUNITY): Payer: Self-pay | Admitting: *Deleted

## 2013-06-17 ENCOUNTER — Encounter (HOSPITAL_COMMUNITY): Payer: 59 | Admitting: Anesthesiology

## 2013-06-17 ENCOUNTER — Ambulatory Visit (HOSPITAL_COMMUNITY)
Admission: RE | Admit: 2013-06-17 | Discharge: 2013-06-17 | Disposition: A | Discharge status: Home / Self Care | Payer: 59 | Source: Ambulatory Visit | Attending: Orthopedic Surgery | Admitting: Orthopedic Surgery

## 2013-06-17 ENCOUNTER — Ambulatory Visit (HOSPITAL_COMMUNITY): Payer: 59 | Admitting: Anesthesiology

## 2013-06-17 DIAGNOSIS — M25819 Other specified joint disorders, unspecified shoulder: Secondary | ICD-10-CM | POA: Insufficient documentation

## 2013-06-17 DIAGNOSIS — K5090 Crohn's disease, unspecified, without complications: Secondary | ICD-10-CM | POA: Insufficient documentation

## 2013-06-17 DIAGNOSIS — M259 Joint disorder, unspecified: Secondary | ICD-10-CM | POA: Insufficient documentation

## 2013-06-17 DIAGNOSIS — M719 Bursopathy, unspecified: Secondary | ICD-10-CM | POA: Insufficient documentation

## 2013-06-17 DIAGNOSIS — M67919 Unspecified disorder of synovium and tendon, unspecified shoulder: Secondary | ICD-10-CM | POA: Insufficient documentation

## 2013-06-17 DIAGNOSIS — R011 Cardiac murmur, unspecified: Secondary | ICD-10-CM | POA: Insufficient documentation

## 2013-06-17 DIAGNOSIS — M19019 Primary osteoarthritis, unspecified shoulder: Secondary | ICD-10-CM | POA: Insufficient documentation

## 2013-06-17 HISTORY — PX: SHOULDER ARTHROSCOPY WITH SUBACROMIAL DECOMPRESSION: SHX5684

## 2013-06-17 SURGERY — SHOULDER ARTHROSCOPY WITH SUBACROMIAL DECOMPRESSION
Anesthesia: Regional | Site: Shoulder | Laterality: Right | Wound class: Clean

## 2013-06-17 MED ORDER — MIDAZOLAM HCL 5 MG/5ML IJ SOLN
INTRAMUSCULAR | Status: DC | PRN
  Administered 2013-06-17: 2 mg via INTRAVENOUS

## 2013-06-17 MED ORDER — OXYCODONE-ACETAMINOPHEN 5-325 MG PO TABS: 1.0000 | ORAL_TABLET | ORAL | Status: DC | PRN

## 2013-06-17 MED ORDER — OXYCODONE HCL 5 MG PO TABS: 5.0000 mg | ORAL_TABLET | Freq: Once | ORAL | Status: DC | PRN

## 2013-06-17 MED ORDER — PROPOFOL 10 MG/ML IV BOLUS
INTRAVENOUS | Status: DC | PRN
  Administered 2013-06-17: 150 mg via INTRAVENOUS

## 2013-06-17 MED ORDER — NAPROXEN 500 MG PO TABS: 500.0000 mg | ORAL_TABLET | Freq: Two times a day (BID) | ORAL | Status: DC

## 2013-06-17 MED ORDER — ONDANSETRON HCL 4 MG/2ML IJ SOLN
4.0000 mg | Freq: Four times a day (QID) | INTRAMUSCULAR | Status: DC | PRN
Start: 2013-06-17 — End: 2013-06-17

## 2013-06-17 MED ORDER — SODIUM CHLORIDE 0.9 % IR SOLN
Status: DC | PRN
  Administered 2013-06-17: 6000 mL

## 2013-06-17 MED ORDER — TEMAZEPAM 30 MG PO CAPS: 30.0000 mg | ORAL_CAPSULE | Freq: Every evening | ORAL | Status: DC | PRN

## 2013-06-17 MED ORDER — LACTATED RINGERS IV SOLN: INTRAVENOUS | Status: DC

## 2013-06-17 MED ORDER — OXYCODONE HCL 5 MG/5ML PO SOLN: 5.0000 mg | Freq: Once | ORAL | Status: DC | PRN

## 2013-06-17 MED ORDER — LACTATED RINGERS IV SOLN
INTRAVENOUS | Status: DC | PRN
  Administered 2013-06-17: 07:00:00 via INTRAVENOUS

## 2013-06-17 MED ORDER — NEOSTIGMINE METHYLSULFATE 1 MG/ML IJ SOLN
INTRAMUSCULAR | Status: DC | PRN
  Administered 2013-06-17: 3 mg via INTRAVENOUS

## 2013-06-17 MED ORDER — HYDROMORPHONE HCL PF 1 MG/ML IJ SOLN: 0.2500 mg | INTRAMUSCULAR | Status: DC | PRN

## 2013-06-17 MED ORDER — DIAZEPAM 2 MG PO TABS: 2.0000 mg | ORAL_TABLET | Freq: Four times a day (QID) | ORAL | Status: DC | PRN

## 2013-06-17 MED ORDER — BUPIVACAINE-EPINEPHRINE PF 0.5-1:200000 % IJ SOLN
INTRAMUSCULAR | Status: DC | PRN
  Administered 2013-06-17: 30 mL

## 2013-06-17 MED ORDER — ONDANSETRON HCL 4 MG/2ML IJ SOLN
INTRAMUSCULAR | Status: DC | PRN
  Administered 2013-06-17: 4 mg via INTRAVENOUS

## 2013-06-17 MED ORDER — GLYCOPYRROLATE 0.2 MG/ML IJ SOLN
INTRAMUSCULAR | Status: DC | PRN
  Administered 2013-06-17: .4 mg via INTRAVENOUS

## 2013-06-17 MED ORDER — FENTANYL CITRATE 0.05 MG/ML IJ SOLN
INTRAMUSCULAR | Status: DC | PRN
  Administered 2013-06-17: 100 ug via INTRAVENOUS

## 2013-06-17 MED ORDER — ROCURONIUM BROMIDE 100 MG/10ML IV SOLN
INTRAVENOUS | Status: DC | PRN
  Administered 2013-06-17: 50 mg via INTRAVENOUS

## 2013-06-17 MED ORDER — LIDOCAINE HCL (CARDIAC) 20 MG/ML IV SOLN
INTRAVENOUS | Status: DC | PRN
  Administered 2013-06-17: 60 mg via INTRAVENOUS

## 2013-06-17 MED ORDER — CHLORHEXIDINE GLUCONATE 4 % EX LIQD: 60.0000 mL | Freq: Once | CUTANEOUS | Status: DC

## 2013-06-17 SURGICAL SUPPLY — 56 items
BLADE CUTTER GATOR 3.5 (BLADE) ×2 IMPLANT
BLADE GREAT WHITE 4.2 (BLADE) ×2 IMPLANT
BLADE SURG 11 STRL SS (BLADE) ×2 IMPLANT
BOOTCOVER CLEANROOM LRG (PROTECTIVE WEAR) ×4 IMPLANT
BUR 3.5 LG SPHERICAL (BURR) IMPLANT
BUR OVAL 4.0 (BURR) ×2 IMPLANT
BURR 3.5 LG SPHERICAL (BURR)
CANISTER SUCT LVC 12 LTR MEDI- (MISCELLANEOUS) ×2 IMPLANT
CANNULA ACUFLEX KIT 5X76 (CANNULA) ×2 IMPLANT
CANNULA DRILOCK 5.0X75 (CANNULA) IMPLANT
CLOSURE STERI-STRIP 1/4X4 (GAUZE/BANDAGES/DRESSINGS) ×2 IMPLANT
CLOTH BEACON ORANGE TIMEOUT ST (SAFETY) ×2 IMPLANT
CONNECTOR 5 IN 1 STRAIGHT STRL (MISCELLANEOUS) ×2 IMPLANT
DRAPE INCISE 23X17 IOBAN STRL (DRAPES) ×1
DRAPE INCISE IOBAN 23X17 STRL (DRAPES) ×1 IMPLANT
DRAPE INCISE IOBAN 66X45 STRL (DRAPES) ×2 IMPLANT
DRAPE STERI 35X30 U-POUCH (DRAPES) ×2 IMPLANT
DRAPE SURG 17X11 SM STRL (DRAPES) ×2 IMPLANT
DRAPE U-SHAPE 47X51 STRL (DRAPES) ×2 IMPLANT
DRSG PAD ABDOMINAL 8X10 ST (GAUZE/BANDAGES/DRESSINGS) ×2 IMPLANT
DURAPREP 26ML APPLICATOR (WOUND CARE) ×4 IMPLANT
ELECT REM PT RETURN 9FT ADLT (ELECTROSURGICAL) ×2
ELECTRODE REM PT RTRN 9FT ADLT (ELECTROSURGICAL) ×1 IMPLANT
GLOVE BIO SURGEON STRL SZ7.5 (GLOVE) ×2 IMPLANT
GLOVE BIO SURGEON STRL SZ8 (GLOVE) ×2 IMPLANT
GLOVE EUDERMIC 7 POWDERFREE (GLOVE) ×2 IMPLANT
GLOVE SS BIOGEL STRL SZ 7.5 (GLOVE) ×1 IMPLANT
GLOVE SUPERSENSE BIOGEL SZ 7.5 (GLOVE) ×1
GOWN STRL NON-REIN LRG LVL3 (GOWN DISPOSABLE) ×2 IMPLANT
GOWN STRL REIN XL XLG (GOWN DISPOSABLE) ×8 IMPLANT
KIT BASIN OR (CUSTOM PROCEDURE TRAY) ×2 IMPLANT
KIT ROOM TURNOVER OR (KITS) ×2 IMPLANT
KIT SHOULDER TRACTION (DRAPES) ×2 IMPLANT
MANIFOLD NEPTUNE II (INSTRUMENTS) ×2 IMPLANT
NDL SUT 6 .5 CRC .975X.05 MAYO (NEEDLE) IMPLANT
NEEDLE MAYO TAPER (NEEDLE)
NEEDLE SPNL 18GX3.5 QUINCKE PK (NEEDLE) ×2 IMPLANT
NS IRRIG 1000ML POUR BTL (IV SOLUTION) ×2 IMPLANT
PACK SHOULDER (CUSTOM PROCEDURE TRAY) ×2 IMPLANT
PAD ARMBOARD 7.5X6 YLW CONV (MISCELLANEOUS) ×4 IMPLANT
SET ARTHROSCOPY TUBING (MISCELLANEOUS) ×1
SET ARTHROSCOPY TUBING LN (MISCELLANEOUS) ×1 IMPLANT
SLING ARM FOAM STRAP LRG (SOFTGOODS) IMPLANT
SLING ARM FOAM STRAP MED (SOFTGOODS) ×2 IMPLANT
SPONGE GAUZE 4X4 12PLY (GAUZE/BANDAGES/DRESSINGS) ×2 IMPLANT
SPONGE LAP 4X18 X RAY DECT (DISPOSABLE) ×2 IMPLANT
STRIP CLOSURE SKIN 1/2X4 (GAUZE/BANDAGES/DRESSINGS) ×2 IMPLANT
SUT MNCRL AB 3-0 PS2 18 (SUTURE) ×2 IMPLANT
SUT PDS AB 0 CT 36 (SUTURE) IMPLANT
SUT RETRIEVER GRASP 30 DEG (SUTURE) IMPLANT
SYR 20CC LL (SYRINGE) ×2 IMPLANT
TAPE PAPER 3X10 WHT MICROPORE (GAUZE/BANDAGES/DRESSINGS) ×2 IMPLANT
TOWEL OR 17X24 6PK STRL BLUE (TOWEL DISPOSABLE) ×2 IMPLANT
TOWEL OR 17X26 10 PK STRL BLUE (TOWEL DISPOSABLE) ×2 IMPLANT
WAND SUCTION MAX 4MM 90S (SURGICAL WAND) ×2 IMPLANT
WATER STERILE IRR 1000ML POUR (IV SOLUTION) ×2 IMPLANT

## 2013-06-17 NOTE — Op Note (Signed)
06/17/2013  8:59 AM  PATIENT:   Jared Gilbert  57 y.o. 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  ASSISTANTS: Shuford pac   ANESTHESIA:   GET + ISB  EBL: min  SPECIMEN:  none  Drains: none   PATIENT DISPOSITION:  PACU - hemodynamically stable.    PLAN OF CARE: Discharge to home after PACU  Dictation# [id number]

## 2013-06-17 NOTE — Anesthesia Preprocedure Evaluation (Addendum)
Anesthesia Evaluation  Patient identified by MRN, date of birth, ID band Patient awake    Reviewed: Allergy & Precautions, H&P , NPO status , Patient's Chart, lab work & pertinent test results  Airway Mallampati: II  Neck ROM: full    Dental  (+) Teeth Intact   Pulmonary neg pulmonary ROS,    Pulmonary exam normal       Cardiovascular + Valvular Problems/Murmurs MVP Rhythm:Regular Rate:Normal     Neuro/Psych  Headaches,    GI/Hepatic Crohn's dz   Endo/Other    Renal/GU      Musculoskeletal  (+) Arthritis -,   Abdominal Normal abdominal exam  (+)   Peds  Hematology   Anesthesia Other Findings   Reproductive/Obstetrics                          Anesthesia Physical Anesthesia Plan  ASA: II  Anesthesia Plan: General and Regional   Post-op Pain Management: MAC Combined w/ Regional for Post-op pain   Induction: Intravenous  Airway Management Planned: Oral ETT  Additional Equipment:   Intra-op Plan:   Post-operative Plan: Extubation in OR  Informed Consent: I have reviewed the patients History and Physical, chart, labs and discussed the procedure including the risks, benefits and alternatives for the proposed anesthesia with the patient or authorized representative who has indicated his/her understanding and acceptance.   Dental advisory given  Plan Discussed with: CRNA, Anesthesiologist and Surgeon  Anesthesia Plan Comments:        Anesthesia Quick Evaluation

## 2013-06-17 NOTE — H&P (Signed)
Jared Gilbert    Chief Complaint: right shoulder impingement with arthritis HPI: The patient is a 57 y.o. male with chronic right shoulder impingement refractory to conservative management.  Past Medical History  Diagnosis Date  . Chicken pox   . Heart murmur   . Eczema   . Blepharitis   . MVP (mitral valve prolapse)     on echo about 10 years ago  2002  . Shoulder bursitis, right     chronic  . Chronic lumbar pain   . Colitis, indeterminate   . Crohn's disease   . Complication of anesthesia     eye surgery difficulty waking up   . Migraines     hx as child   . Neuritis of upper extremity     right upper extremities  . Arthritis   . Kidney stones     Past Surgical History  Procedure Laterality Date  . Appendectomy    . Tonsilectomy, adenoidectomy, bilateral myringotomy and tubes    . Belpharoptosis repair    . Colonoscopy  06/29/2012    Procedure: COLONOSCOPY;  Surgeon: Beverley Fiedler, MD;  Location: [facility];  Service: Gastroenterology;  Laterality: N/A;  . No past surgeries      no myringotomy/tubes  . Shoulder surgery      left    Family History  Problem Relation Age of Onset  . Hyperlipidemia Mother   . Hypertension Mother   . Stroke Father   . Atrial fibrillation Father     Social History:  reports that he has never smoked. He has never used smokeless tobacco. He reports that he drinks alcohol. He reports that he does not use illicit drugs.  Allergies:  Allergies  Allergen Reactions  . Contrast Media [Iodinated Diagnostic Agents]     As a child has reaction    Medications Prior to Admission  Medication Sig Dispense Refill  . cycloSPORINE (RESTASIS) 0.05 % ophthalmic emulsion Place 1 drop into both eyes 2 (two) times daily.      Marland Kitchen ketoconazole (NIZORAL) 2 % cream Apply topically 2 (two) times daily.  30 g  3  . mesalamine (LIALDA) 1.2 G EC tablet Take 1,200 mg by mouth daily with breakfast.         Physical Exam: right shoulder with painful and  restricted motion as noted at recent office visits  Vitals  Temp:  [97.5 F (36.4 C)] 97.5 F (36.4 C) (11/13 0555) Resp:  [20] 20 (11/13 0555) BP: (118)/(82) 118/82 mmHg (11/13 0555) SpO2:  [100 %] 100 % (11/13 0555)  Assessment/Plan  Impression: right shoulder impingement with arthritis  Plan of Action: Procedure(s): RIGHT SHOULDER ARTHROSCOPY WITH SUBACROMIAL DECOMPRESSION/DISTAL CLAVICLE RESECTION  Jared Gilbert M 06/17/2013, 7:34 AM

## 2013-06-17 NOTE — Anesthesia Postprocedure Evaluation (Signed)
Anesthesia Post Note  Patient: Jared Gilbert  Procedure(s) Performed: Procedure(s) (LRB): RIGHT SHOULDER ARTHROSCOPY WITH SUBACROMIAL DECOMPRESSION/DISTAL CLAVICLE RESECTION (Right)  Anesthesia type: General  Patient location: PACU  Post pain: Pain level controlled and Adequate analgesia  Post assessment: Post-op Vital signs reviewed, Patient's Cardiovascular Status Stable, Respiratory Function Stable, Patent Airway and Pain level controlled  Last Vitals:  Filed Vitals:   06/17/13 1015  BP: 122/81  Pulse: 52  Temp:   Resp: 11    Post vital signs: Reviewed and stable  Level of consciousness: awake, alert  and oriented  Complications: No apparent anesthesia complications

## 2013-06-17 NOTE — Transfer of Care (Signed)
Immediate Anesthesia Transfer of Care Note  Patient: Jared Gilbert  Procedure(s) Performed: Procedure(s): RIGHT SHOULDER ARTHROSCOPY WITH SUBACROMIAL DECOMPRESSION/DISTAL CLAVICLE RESECTION (Right)  Patient Location: PACU  Anesthesia Type:General  Level of Consciousness: awake, alert  and oriented  Airway & Oxygen Therapy: Patient Spontanous Breathing  Post-op Assessment: Report given to PACU RN  Post vital signs: Reviewed and stable  Complications: No apparent anesthesia complications

## 2013-06-17 NOTE — Anesthesia Procedure Notes (Signed)
Anesthesia Regional Block:  Interscalene brachial plexus block  Pre-Anesthetic Checklist: ,, timeout performed, Correct Patient, Correct Site, Correct Laterality, Correct Procedure, Correct Position, site marked, Risks and benefits discussed,  Surgical consent,  Pre-op evaluation,  At surgeon's request and post-op pain management  Laterality: Right  Prep: chloraprep       Needles:  Injection technique: Single-shot  Needle Type: Echogenic Stimulator Needle     Needle Length: 5cm 5 cm Needle Gauge: 22 and 22 G    Additional Needles:  Procedures: ultrasound guided (picture in chart) and nerve stimulator Interscalene brachial plexus block  Nerve Stimulator or Paresthesia:  Response: biceps flexion, 0.45 mA,   Additional Responses:   Narrative:  Start time: 06/17/2013 7:10 AM End time: 06/17/2013 7:24 AM Injection made incrementally with aspirations every 5 mL.  Performed by: Personally  Anesthesiologist: Dr Chaney Malling  Additional Notes: Functioning IV was confirmed and monitors were applied.  A 50mm 22ga Arrow echogenic stimulator needle was used. Sterile prep and drape,hand hygiene and sterile gloves were used.  Negative aspiration and negative test dose prior to incremental administration of local anesthetic. The patient tolerated the procedure well.  Ultrasound guidance: relevent anatomy identified, needle position confirmed, local anesthetic spread visualized around nerve(s), vascular puncture avoided.  Image printed for medical record.   Interscalene brachial plexus block

## 2013-06-17 NOTE — Preoperative (Signed)
Beta Blockers   Reason not to administer Beta Blockers:Not Applicable 

## 2013-06-18 NOTE — Op Note (Signed)
NAME:  Jared Gilbert, Jared Gilbert NO.:  [id number]  MEDICAL RECORD NO.:  [id number]  LOCATION:  [facility]                         FACILITY:  [facility]  PHYSICIAN:  Vania Rea. Asia Dusenbury, M.D.  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  POSTOPERATIVE DIAGNOSES: 1. Chronic right shoulder impingement syndrome with symptomatic     acromioclavicular joint arthropathy. 2. Right shoulder partial articular rotator cuff tear. 3. Right shoulder superior labral tear type 1 superior labrum anterior     and posterior lesion.  PROCEDURES: 1. Right shoulder examination under anesthesia. 2. Right shoulder glenohumeral joint diagnostic arthroscopy. 3. Debridement of type 1 superior labrum anterior and posterior     lesion. 4. Debridement of partial articular rotator cuff tear. 5. Arthroscopic subacromial decompression and bursectomy. 6. Arthroscopic distal clavicle resection.  SURGEON:  Vania Rea. Lakiyah Arntson, M.D.  Threasa HeadsFrench Ana A. Shuford, P.A.-C.  ANESTHESIA:  General endotracheal as well as an interscalene block.  ESTIMATED BLOOD LOSS:  Minimal.  DRAINS:  None.  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  Due to his ongoing pain and functional limitations, he was brought to the operating room at this time for planned right shoulder arthroscopy as described below.  Preoperatively, I counseled Dr. Lequita Halt on treatment options as well as risks versus benefits thereof. Possible surgical complications were reviewed including the potential for bleeding, infection,  neurovascular injury, persistent pain, loss of motion, anesthetic complication, and possible need for additional surgery.  He understands and accepts and agrees with our planned procedure.  PROCEDURE IN DETAIL:  After undergoing routine preop evaluation, the patient received prophylactic antibiotics and an interscalene block was established in the holding area by the Anesthesia Department.  He was placed supine on the operating table, underwent smooth induction of a general endotracheal anesthesia.  He was turned to left lateral decubitus position on a beanbag and appropriately padded and protected. Right shoulder examination under anesthesia revealed full motion.  No instability patterns were noted.  Right arm was suspended at 70 degrees of abduction with 10 pounds of traction.  The right shoulder girdle region was sterilely prepped and draped in standard fashion.  Time-out was called.  A posterior portal was established into the glenohumeral joint.  The anterior portal was established under direct visualization. The glenohumeral articular surfaces were all found to be in excellent condition.  There was extensive degenerative tearing of the superior labrum consistent with type 1 SLAP lesion.  This was debrided with shaver back to stable margin.  Biceps anchor was stable.  Biceps had normal caliber with no evidence for distal instability or pathologies. The rotator cuff showed some partial articular-sided tearing, the distal supraspinatus estimated at perhaps no more than 5-10% of the thickness of the tendon.  These areas were debrided with a shaver.  No instability patterns were noted.  Remaining  inspection of the glenohumeral joint showed no additional pathologies.  At this point, fluid and instruments were then removed.  The arm was dropped down to 30 degrees with abduction with the arthroscope introduced in the subacromial space of the posterior portal and the direct lateral portal  was established into the subacromial space.  Abundant dense bursal tissue and multiple adhesions were encountered and these were all divided and excised with combination of shaver and the Stryker wand.  The wand was then used to remove the periosteum from the undersurface of the anterior half of the acromion.  We found a very prominent anterior-inferior acromial hook adjacent to the [facility] joint, temporally the overgrowth of the [facility] joint inferiorly caused severe bony impingement on the underlying rotator cuff.  A bur was introduced and used to perform subacromial decompression creating a type 1 morphology.  Portal was then established directly anterior to the distal clavicle and then distal clavicle resection was performed with the bur.  Care was taken to confirm visualization of the entire circumference of the distal clavicle to ensure adequate removal of the bone.  We then completed the subacromial/subdeltoid bursectomy.  The bursal surface of the rotator cuff was carefully inspected and found to be intact.  Final hemostasis was obtained.  Fluid and instruments were removed.  Portals were closed with Monocryl and Steri-Strips.  Dry dressing was applied.  The right shoulder was placed in a sling.  The patient was awakened, extubated, and taken to the recovery room in stable condition.  Ralene Bathe, PA-C was used and assisted throughout this case essential for help with positioning of the extremity, management of the arthroscopic equipment, tissue manipulation, wound closer and intraoperative decision making.     Vania Rea. Ry Moody, M.D.     KMS/MEDQ  D:  06/17/2013  T:  06/18/2013  Job:  [id number]

## 2013-06-21 ENCOUNTER — Encounter (HOSPITAL_COMMUNITY): Payer: Self-pay | Admitting: Orthopedic Surgery

## 2013-06-28 ENCOUNTER — Ambulatory Visit: Payer: 59 | Attending: Orthopedic Surgery

## 2013-06-28 DIAGNOSIS — M25519 Pain in unspecified shoulder: Secondary | ICD-10-CM | POA: Insufficient documentation

## 2013-06-28 DIAGNOSIS — IMO0001 Reserved for inherently not codable concepts without codable children: Secondary | ICD-10-CM | POA: Insufficient documentation

## 2013-06-28 DIAGNOSIS — R293 Abnormal posture: Secondary | ICD-10-CM | POA: Insufficient documentation

## 2013-06-28 DIAGNOSIS — M25619 Stiffness of unspecified shoulder, not elsewhere classified: Secondary | ICD-10-CM | POA: Insufficient documentation

## 2013-07-06 ENCOUNTER — Ambulatory Visit: Payer: 59

## 2013-08-06 ENCOUNTER — Other Ambulatory Visit: Payer: Self-pay | Admitting: Internal Medicine

## 2013-08-12 ENCOUNTER — Other Ambulatory Visit: Payer: Self-pay | Admitting: Gastroenterology

## 2013-08-14 ENCOUNTER — Telehealth: Payer: Self-pay | Admitting: Internal Medicine

## 2013-08-14 NOTE — Telephone Encounter (Signed)
[place], [place] - [address] requesting 90 day refill of cycloSPORINE (RESTASIS) 0.05 % ophthalmic emulsion, last filled 10/30/12

## 2013-08-16 ENCOUNTER — Other Ambulatory Visit: Payer: Self-pay | Admitting: *Deleted

## 2013-08-16 MED ORDER — CYCLOSPORINE 0.05 % OP EMUL: 1.0000 [drp] | Freq: Two times a day (BID) | OPHTHALMIC | Status: DC

## 2013-11-02 ENCOUNTER — Other Ambulatory Visit: Payer: Self-pay | Admitting: Internal Medicine

## 2013-12-21 ENCOUNTER — Other Ambulatory Visit: Payer: Self-pay | Admitting: Gastroenterology

## 2014-01-03 ENCOUNTER — Other Ambulatory Visit: Payer: Self-pay | Admitting: Gastroenterology

## 2014-01-25 ENCOUNTER — Telehealth: Payer: Self-pay | Admitting: Internal Medicine

## 2014-01-25 NOTE — Telephone Encounter (Signed)
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  I offered our new providers, insists on you.  Will you accept?

## 2014-01-25 NOTE — Telephone Encounter (Signed)
Yes

## 2014-01-28 NOTE — Telephone Encounter (Signed)
Pt is Development worker, [employer] of [place],  lmom on patient's voicemail, Dr. Elease Hashimoto will accept as a new patient.

## 2014-04-04 ENCOUNTER — Ambulatory Visit (INDEPENDENT_AMBULATORY_CARE_PROVIDER_SITE_OTHER): Payer: 59 | Admitting: Family Medicine

## 2014-04-04 ENCOUNTER — Encounter: Payer: Self-pay | Admitting: Family Medicine

## 2014-04-04 VITALS — BP 128/70 | HR 60 | Temp 98.2°F | Wt 150.0 lb

## 2014-04-04 DIAGNOSIS — K523 Indeterminate colitis: Secondary | ICD-10-CM

## 2014-04-04 DIAGNOSIS — Z2911 Encounter for prophylactic immunotherapy for respiratory syncytial virus (RSV): Secondary | ICD-10-CM

## 2014-04-04 DIAGNOSIS — K5289 Other specified noninfective gastroenteritis and colitis: Secondary | ICD-10-CM

## 2014-04-04 DIAGNOSIS — Z23 Encounter for immunization: Secondary | ICD-10-CM

## 2014-04-04 MED ORDER — KETOCONAZOLE 2 % EX CREA: TOPICAL_CREAM | Freq: Every day | CUTANEOUS | Status: DC | PRN

## 2014-04-04 MED ORDER — AZITHROMYCIN 1 % OP SOLN: 1.0000 [drp] | Freq: Every day | OPHTHALMIC | Status: AC

## 2014-04-04 MED ORDER — CYCLOSPORINE 0.05 % OP EMUL: 1.0000 [drp] | Freq: Two times a day (BID) | OPHTHALMIC | Status: DC

## 2014-04-04 MED ORDER — MESALAMINE 1.2 G PO TBEC: 1200.0000 mg | DELAYED_RELEASE_TABLET | Freq: Every day | ORAL | Status: DC

## 2014-04-04 NOTE — Progress Notes (Signed)
   Subjective:    Patient ID: Loghan Kurtzman, male    DOB: 11/23/1955, 58 y.o.   MRN: [id number]  HPI Seen for routine followup and for some medication refills. He has chronic problems including history of Crohn's colitis, reported mitral valve prolapse, dry eyes.  Generally very healthy. Crohn's disease has been stable. He takes low-dose Lialda-usually one daily. Appetite and weight have been stable. Needs refills of regular medications today. No flu vaccine yet. No history of shingles vaccine. No history of Prevnar. Exercises regularly.   Generally feels well. Never smoked.  Past Medical History  Diagnosis Date  . Chicken pox   . Heart murmur   . Eczema   . Blepharitis   . MVP (mitral valve prolapse)     on echo about 10 years ago  2002  . Shoulder bursitis, right     chronic  . Chronic lumbar pain   . Colitis, indeterminate   . Crohn's disease   . Complication of anesthesia     eye surgery difficulty waking up   . Migraines     hx as child   . Neuritis of upper extremity     right upper extremities  . Arthritis   . Kidney stones    Past Surgical History  Procedure Laterality Date  . Appendectomy    . Tonsilectomy, adenoidectomy, bilateral myringotomy and tubes    . Belpharoptosis repair    . Colonoscopy  06/29/2012    Procedure: COLONOSCOPY;  Surgeon: Jerene Bears, MD;  Location: [facility];  Service: Gastroenterology;  Laterality: N/A;  . No past surgeries      no myringotomy/tubes  . Shoulder surgery      left  . Shoulder arthroscopy with subacromial decompression Right 06/17/2013    Procedure: RIGHT SHOULDER ARTHROSCOPY WITH SUBACROMIAL DECOMPRESSION/DISTAL CLAVICLE RESECTION;  Surgeon: Marin Shutter, MD;  Location: [place];  Service: Orthopedics;  Laterality: Right;    reports that he has never smoked. He has never used smokeless tobacco. He reports that he drinks alcohol. He reports that he does not use illicit drugs. family history includes Atrial fibrillation  in his father; Hyperlipidemia in his mother; Hypertension in his mother; Stroke in his father. Allergies  Allergen Reactions  . Contrast Media [Iodinated Diagnostic Agents]     As a child has reaction      Review of Systems  Constitutional: Negative for appetite change and unexpected weight change.  Gastrointestinal: Negative for abdominal pain.       Objective:   Physical Exam  Constitutional: He appears well-developed and well-nourished.  Cardiovascular: Normal rate and regular rhythm.   Murmur heard. Soft murmur left sternal border  Pulmonary/Chest: Effort normal and breath sounds normal. No respiratory distress. He has no wheezes. He has no rales.          Assessment & Plan:  Crohn's colitis. Symptomatically stable. Refill Lialda for one year. colonosocopy is up to date.  Health maintenance. Flu vaccine given. Shingles vaccine given. He'll return later for Prevnar 13.

## 2014-04-04 NOTE — Progress Notes (Signed)
Pre visit review using our clinic review tool, if applicable. No additional management support is needed unless otherwise documented below in the visit note. 

## 2014-05-09 ENCOUNTER — Ambulatory Visit (INDEPENDENT_AMBULATORY_CARE_PROVIDER_SITE_OTHER): Payer: 59 | Admitting: Family Medicine

## 2014-05-09 DIAGNOSIS — Z23 Encounter for immunization: Secondary | ICD-10-CM

## 2014-05-18 ENCOUNTER — Telehealth: Payer: Self-pay | Admitting: Family Medicine

## 2014-05-18 MED ORDER — LOTEPREDNOL ETABONATE 0.5 % OP SUSP: 1.0000 [drp] | Freq: Two times a day (BID) | OPHTHALMIC | Status: DC

## 2014-05-18 NOTE — Telephone Encounter (Signed)
Refill OK for one year.

## 2014-05-18 NOTE — Telephone Encounter (Signed)
Rx sent to pharmacy   

## 2014-05-18 NOTE — Telephone Encounter (Signed)
Pt is not currently using the drops and is not currently on chart.

## 2014-05-18 NOTE — Telephone Encounter (Signed)
[facility] - [place], [place] - [address]. Is requesting re-fill on loteprednol (LOTEMAX) 0.5 % ophthalmic suspension

## 2014-06-07 ENCOUNTER — Encounter: Payer: Self-pay | Admitting: Family Medicine

## 2015-03-14 ENCOUNTER — Other Ambulatory Visit: Payer: 59

## 2015-03-21 ENCOUNTER — Encounter: Payer: 59 | Admitting: Family Medicine

## 2015-03-30 ENCOUNTER — Other Ambulatory Visit (INDEPENDENT_AMBULATORY_CARE_PROVIDER_SITE_OTHER): Payer: 59

## 2015-03-30 ENCOUNTER — Encounter: Payer: Self-pay | Admitting: Family Medicine

## 2015-03-30 DIAGNOSIS — Z0000 Encounter for general adult medical examination without abnormal findings: Secondary | ICD-10-CM

## 2015-03-30 LAB — HEPATIC FUNCTION PANEL
ALBUMIN: 4.1 g/dL (ref 3.5–5.2)
ALK PHOS: 36 U/L — AB (ref 39–117)
ALT: 17 U/L (ref 0–53)
AST: 26 U/L (ref 0–37)
Bilirubin, Direct: 0.1 mg/dL (ref 0.0–0.3)
Total Bilirubin: 0.4 mg/dL (ref 0.2–1.2)
Total Protein: 6.5 g/dL (ref 6.0–8.3)

## 2015-03-30 LAB — TSH: TSH: 2.6 u[IU]/mL (ref 0.35–4.50)

## 2015-03-30 LAB — CBC WITH DIFFERENTIAL/PLATELET
Basophils Absolute: 0 10*3/uL (ref 0.0–0.1)
Basophils Relative: 0.6 % (ref 0.0–3.0)
Eosinophils Absolute: 0.1 10*3/uL (ref 0.0–0.7)
Eosinophils Relative: 2.8 % (ref 0.0–5.0)
HCT: 39.6 % (ref 39.0–52.0)
Hemoglobin: 13.3 g/dL (ref 13.0–17.0)
Lymphocytes Relative: 32.8 % (ref 12.0–46.0)
Lymphs Abs: 1.5 10*3/uL (ref 0.7–4.0)
MCHC: 33.6 g/dL (ref 30.0–36.0)
MCV: 95.7 fl (ref 78.0–100.0)
MONO ABS: 0.4 10*3/uL (ref 0.1–1.0)
Monocytes Relative: 8.8 % (ref 3.0–12.0)
NEUTROS ABS: 2.6 10*3/uL (ref 1.4–7.7)
NEUTROS PCT: 55 % (ref 43.0–77.0)
PLATELETS: 173 10*3/uL (ref 150.0–400.0)
RBC: 4.14 Mil/uL — ABNORMAL LOW (ref 4.22–5.81)
RDW: 13.4 % (ref 11.5–15.5)
WBC: 4.7 10*3/uL (ref 4.0–10.5)

## 2015-03-30 LAB — LIPID PANEL
CHOLESTEROL: 170 mg/dL (ref 0–200)
HDL: 60.4 mg/dL (ref >39.00)
LDL Cholesterol: 102 mg/dL — ABNORMAL HIGH (ref 0–99)
NonHDL: 109.89
TRIGLYCERIDES: 39 mg/dL (ref 0.0–149.0)
Total CHOL/HDL Ratio: 3
VLDL: 7.8 mg/dL (ref 0.0–40.0)

## 2015-03-30 LAB — BASIC METABOLIC PANEL
BUN: 22 mg/dL (ref 6–23)
CO2: 30 meq/L (ref 19–32)
CREATININE: 0.9 mg/dL (ref 0.40–1.50)
Calcium: 9.2 mg/dL (ref 8.4–10.5)
Chloride: 103 mEq/L (ref 96–112)
GFR: 91.05 mL/min (ref >60.00)
Glucose, Bld: 86 mg/dL (ref 70–99)
Potassium: 4.2 mEq/L (ref 3.5–5.1)
Sodium: 140 mEq/L (ref 135–145)

## 2015-03-30 LAB — PSA: PSA: 1.48 ng/mL (ref 0.10–4.00)

## 2015-04-26 ENCOUNTER — Ambulatory Visit (INDEPENDENT_AMBULATORY_CARE_PROVIDER_SITE_OTHER): Payer: 59 | Admitting: Family Medicine

## 2015-04-26 ENCOUNTER — Encounter: Payer: Self-pay | Admitting: Family Medicine

## 2015-04-26 ENCOUNTER — Encounter: Payer: Self-pay | Admitting: *Deleted

## 2015-04-26 VITALS — BP 124/80 | HR 67 | Temp 98.6°F | Ht 74.0 in | Wt 153.9 lb

## 2015-04-26 DIAGNOSIS — I340 Nonrheumatic mitral (valve) insufficiency: Secondary | ICD-10-CM

## 2015-04-26 DIAGNOSIS — N200 Calculus of kidney: Secondary | ICD-10-CM | POA: Diagnosis not present

## 2015-04-26 DIAGNOSIS — Z23 Encounter for immunization: Secondary | ICD-10-CM | POA: Diagnosis not present

## 2015-04-26 DIAGNOSIS — Z0000 Encounter for general adult medical examination without abnormal findings: Secondary | ICD-10-CM

## 2015-04-26 MED ORDER — LIALDA 1.2 G PO TBEC: 2.4000 g | DELAYED_RELEASE_TABLET | Freq: Every day | ORAL | Status: DC

## 2015-04-26 MED ORDER — TAMSULOSIN HCL 0.4 MG PO CAPS: 0.4000 mg | ORAL_CAPSULE | Freq: Every day | ORAL | Status: DC

## 2015-04-26 NOTE — Progress Notes (Signed)
Pre visit review using our clinic review tool, if applicable. No additional management support is needed unless otherwise documented below in the visit note. 

## 2015-04-26 NOTE — Progress Notes (Signed)
Subjective:    Patient ID: Jared Gilbert, male    DOB: 11/30/1955, 59 y.o.   MRN: [id number]  HPI  Patient here for complete physical. He has history of possible Crohn's disease and has occasional flareups- usually controlled with Lialda,. He takes this intermittently. He had third kidney stone back in March which was 5 mm. No recent flank pain or other concerns. Requesting refill Flomax to use as needed for any future recurrences.  History of questionable mitral valve prolapse. He's not had any recent echocardiogram assessment. His wife had recently noted murmur which she auscultated. He has not had any recent increased dyspnea with exertion or exercise intolerance.  He takes no regular medications. Immunizations up-to-date with the exception of needs flu vaccine for this season. Colonoscopy up-to-date  Past Medical History  Diagnosis Date  . Chicken pox   . Heart murmur   . Eczema   . Blepharitis   . MVP (mitral valve prolapse)     on echo about 10 years ago  2002  . Shoulder bursitis, right     chronic  . Chronic lumbar pain   . Colitis, indeterminate   . Crohn's disease   . Complication of anesthesia     eye surgery difficulty waking up   . Migraines     hx as child   . Neuritis of upper extremity     right upper extremities  . Arthritis   . Kidney stones    Past Surgical History  Procedure Laterality Date  . Appendectomy    . Tonsilectomy, adenoidectomy, bilateral myringotomy and tubes    . Belpharoptosis repair    . Colonoscopy  06/29/2012    Procedure: COLONOSCOPY;  Surgeon: Jerene Bears, MD;  Location: [facility];  Service: Gastroenterology;  Laterality: N/A;  . No past surgeries      no myringotomy/tubes  . Shoulder surgery      left  . Shoulder arthroscopy with subacromial decompression Right 06/17/2013    Procedure: RIGHT SHOULDER ARTHROSCOPY WITH SUBACROMIAL DECOMPRESSION/DISTAL CLAVICLE RESECTION;  Surgeon: Marin Shutter, MD;  Location: [facility];   Service: Orthopedics;  Laterality: Right;    reports that he has never smoked. He has never used smokeless tobacco. He reports that he drinks alcohol. He reports that he does not use illicit drugs. family history includes Atrial fibrillation in his father; Hyperlipidemia in his mother; Hypertension in his mother; Stroke in his father. Allergies  Allergen Reactions  . Contrast Media [Iodinated Diagnostic Agents]     As a child has reaction     Review of Systems  Constitutional: Negative for fever, activity change, appetite change and fatigue.  HENT: Negative for congestion, ear pain and trouble swallowing.   Eyes: Negative for pain and visual disturbance.  Respiratory: Negative for cough, shortness of breath and wheezing.   Cardiovascular: Negative for chest pain and palpitations.  Gastrointestinal: Negative for nausea, vomiting, abdominal pain, diarrhea, constipation, blood in stool, abdominal distention and rectal pain.  Genitourinary: Negative for dysuria, hematuria and testicular pain.  Musculoskeletal: Negative for joint swelling and arthralgias.  Skin: Negative for rash.  Neurological: Negative for dizziness, syncope and headaches.  Hematological: Negative for adenopathy.  Psychiatric/Behavioral: Negative for confusion and dysphoric mood.       Objective:   Physical Exam  Constitutional: He is oriented to person, place, and time. He appears well-developed and well-nourished. No distress.  HENT:  Head: Normocephalic and atraumatic.  Right Ear: External ear normal.  Left Ear: External  ear normal.  Mouth/Throat: Oropharynx is clear and moist.  Eyes: EOM are normal. Pupils are equal, round, and reactive to light.  Neck: Normal range of motion. Neck supple. No thyromegaly present.  Cardiovascular: Normal rate and regular rhythm.  Exam reveals no gallop.   Murmur heard. 2/6 SEM over mitral valve area.    Pulmonary/Chest: No respiratory distress. He has no wheezes. He has no  rales.  Abdominal: Soft. Bowel sounds are normal. He exhibits no distension and no mass. There is no tenderness. There is no rebound and no guarding.  Musculoskeletal: He exhibits no edema.  Lymphadenopathy:    He has no cervical adenopathy.  Neurological: He is alert and oriented to person, place, and time.  Skin: No rash noted.          Assessment & Plan:  Complete physical. Labs reviewed with no concerns. Refill Lialda and Flomax.  Flu vaccine given. Other immunizations up to date. Colonoscopy is up to date.  He does have fairly prominent murmur over the mitral valve area -suspect mitral regurge/mitral insufficiency murmur. Set up echocardiogram to further assess.

## 2015-04-27 ENCOUNTER — Telehealth (HOSPITAL_COMMUNITY): Payer: Self-pay | Admitting: *Deleted

## 2015-04-27 DIAGNOSIS — N200 Calculus of kidney: Secondary | ICD-10-CM | POA: Insufficient documentation

## 2015-05-12 ENCOUNTER — Telehealth (HOSPITAL_COMMUNITY): Payer: Self-pay | Admitting: *Deleted

## 2015-05-14 ENCOUNTER — Encounter: Payer: Self-pay | Admitting: Family Medicine

## 2015-05-15 ENCOUNTER — Other Ambulatory Visit: Payer: Self-pay | Admitting: Family Medicine

## 2015-05-15 MED ORDER — CYCLOSPORINE 0.05 % OP EMUL: 1.0000 [drp] | Freq: Two times a day (BID) | OPHTHALMIC | Status: DC

## 2015-05-22 ENCOUNTER — Other Ambulatory Visit (HOSPITAL_COMMUNITY): Payer: 59

## 2015-06-12 ENCOUNTER — Other Ambulatory Visit: Payer: Self-pay

## 2015-06-12 ENCOUNTER — Ambulatory Visit (HOSPITAL_COMMUNITY): Payer: 59 | Attending: Cardiology

## 2015-06-12 DIAGNOSIS — I340 Nonrheumatic mitral (valve) insufficiency: Secondary | ICD-10-CM | POA: Insufficient documentation

## 2015-06-13 ENCOUNTER — Encounter: Payer: Self-pay | Admitting: Family Medicine

## 2015-08-18 MED FILL — DENTA 5000 PLUS CREAM: 1.1 | 30 days supply | Qty: 51 | Fill #0

## 2015-08-21 DIAGNOSIS — H52223 Regular astigmatism, bilateral: Secondary | ICD-10-CM | POA: Diagnosis not present

## 2015-08-21 DIAGNOSIS — H5203 Hypermetropia, bilateral: Secondary | ICD-10-CM | POA: Diagnosis not present

## 2015-09-27 ENCOUNTER — Other Ambulatory Visit: Payer: Self-pay | Admitting: Family Medicine

## 2015-09-27 ENCOUNTER — Encounter: Payer: Self-pay | Admitting: Family Medicine

## 2015-09-27 MED ORDER — KETOCONAZOLE 2 % EX CREA
TOPICAL_CREAM | Freq: Every day | CUTANEOUS | Status: DC | PRN
Start: 2015-09-27 — End: 2016-05-06

## 2015-09-27 MED FILL — RESTASIS 0.05% EYE EMULSION: 0.05 | 30 days supply | Qty: 60 | Fill #2

## 2015-09-27 MED FILL — LIALDA 1.2 GM TABLET SA: 1.2 | 30 days supply | Qty: 60 | Fill #1

## 2015-09-27 MED FILL — KETOCONAZOLE 2% CREAM: 2 | 30 days supply | Qty: 60 | Fill #0

## 2015-10-02 ENCOUNTER — Telehealth: Payer: 59 | Admitting: Family

## 2015-10-02 DIAGNOSIS — J069 Acute upper respiratory infection, unspecified: Secondary | ICD-10-CM

## 2015-10-02 DIAGNOSIS — R05 Cough: Secondary | ICD-10-CM | POA: Diagnosis not present

## 2015-10-02 DIAGNOSIS — R059 Cough, unspecified: Secondary | ICD-10-CM

## 2015-10-02 MED ORDER — LEVOFLOXACIN 500 MG PO TABS: 500.0000 mg | ORAL_TABLET | Freq: Every day | ORAL | Status: DC

## 2015-10-02 MED ORDER — BENZONATATE 100 MG PO CAPS: 100.0000 mg | ORAL_CAPSULE | Freq: Two times a day (BID) | ORAL | Status: DC | PRN

## 2015-10-02 NOTE — Progress Notes (Signed)
We are sorry that you are not feeling well.  Here is how we plan to help!  Based on what you have shared with me it looks like you have upper respiratory tract inflammation that has resulted in a significant cough.  Inflammation and infection in the upper respiratory tract is commonly called bronchitis and has four common causes:  Allergies, Viral Infections, Acid Reflux and Bacterial Infections.  Allergies, viruses and acid reflux are treated by controlling symptoms or eliminating the cause. An example might be a cough caused by taking certain blood pressure medications. You stop the cough by changing the medication. Another example might be a cough caused by acid reflux. Controlling the reflux helps control the cough.  Based on your presentation I believe you most likely have A cough due to bacteria.  When patients have a fever and a productive cough with a change in color or increased sputum production, we are concerned about bacterial bronchitis.  If left untreated it can progress to pneumonia.  If your symptoms do not improve with your treatment plan it is important that you contact your provider.   I hve prescribed Levofloxacin 500 mg daily for 7 days. This may be viral, but could possibly be pneumonia. If you do not improve you need to follow up with your primary care provider.   In addition you may use A non-prescription cough medication called Robitussin DAC. Take 2 teaspoons every 8 hours or Delsym: take 2 teaspoons every 12 hours., A non-prescription cough medication called Mucinex DM: take 2 tablets every 12 hours. and A prescription cough medication called Tessalon Perles 100mg . You may take 1-2 capsules every 8 hours as needed for your cough.    HOME CARE . Only take medications as instructed by your medical team. . Complete the entire course of an antibiotic. . Drink plenty of fluids and get plenty of rest. . Avoid close contacts especially the very young and the elderly . Cover your  mouth if you cough or cough into your sleeve. . Always remember to wash your hands . A steam or ultrasonic humidifier can help congestion.    GET HELP RIGHT AWAY IF: . You develop worsening fever. . You become short of breath . You cough up blood. . Your symptoms persist after you have completed your treatment plan MAKE SURE YOU   Understand these instructions.  Will watch your condition.  Will get help right away if you are not doing well or get worse.  Your e-visit answers were reviewed by a board certified advanced clinical practitioner to complete your personal care plan.  Depending on the condition, your plan could have included both over the counter or prescription medications. If there is a problem please reply  once you have received a response from your provider. Your safety is important to [place].  If you have drug allergies check your prescription carefully.    You can use MyChart to ask questions about today's visit, request a non-urgent call back, or ask for a work or school excuse for 24 hours related to this e-Visit. If it has been greater than 24 hours you will need to follow up with your provider, or enter a new e-Visit to address those concerns. You will get an e-mail in the next two days asking about your experience.  I hope that your e-visit has been valuable and will speed your recovery. Thank you for using e-visits.

## 2015-12-06 DIAGNOSIS — L820 Inflamed seborrheic keratosis: Secondary | ICD-10-CM | POA: Diagnosis not present

## 2015-12-06 DIAGNOSIS — L570 Actinic keratosis: Secondary | ICD-10-CM | POA: Diagnosis not present

## 2015-12-06 DIAGNOSIS — L814 Other melanin hyperpigmentation: Secondary | ICD-10-CM | POA: Diagnosis not present

## 2015-12-11 ENCOUNTER — Other Ambulatory Visit: Payer: Self-pay | Admitting: Family Medicine

## 2015-12-11 MED FILL — LIALDA 1.2 GM TABLET SA: 1.2 | 30 days supply | Qty: 60 | Fill #2

## 2015-12-11 MED FILL — KETOCONAZOLE 2% CREAM: 2 | 30 days supply | Qty: 60 | Fill #1

## 2015-12-11 MED FILL — RESTASIS 0.05% EYE EMULSION: 0.05 | 30 days supply | Qty: 60 | Fill #3

## 2015-12-11 MED FILL — LOTEMAX 0.5% EYE DROPS: 0.5 | 25 days supply | Qty: 5 | Fill #0

## 2016-01-10 ENCOUNTER — Ambulatory Visit (INDEPENDENT_AMBULATORY_CARE_PROVIDER_SITE_OTHER): Payer: 59 | Admitting: Family Medicine

## 2016-01-10 ENCOUNTER — Encounter: Payer: Self-pay | Admitting: Family Medicine

## 2016-01-10 VITALS — BP 128/80 | HR 77 | Temp 97.9°F | Ht 74.0 in | Wt 159.0 lb

## 2016-01-10 DIAGNOSIS — M5416 Radiculopathy, lumbar region: Secondary | ICD-10-CM | POA: Diagnosis not present

## 2016-01-10 DIAGNOSIS — M545 Low back pain: Secondary | ICD-10-CM | POA: Diagnosis not present

## 2016-01-10 MED ORDER — METHYLPREDNISOLONE ACETATE 80 MG/ML IJ SUSP
80.0000 mg | Freq: Once | INTRAMUSCULAR | Status: AC
  Administered 2016-01-10: 80 mg via INTRAMUSCULAR

## 2016-01-10 NOTE — Progress Notes (Addendum)
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   MRN: [id number]  HPI Acute visit for left lumbar back pain with left lumbar radiculopathy symptoms over the past 6-8 weeks Denies recent injury. No prior history of back difficulties. Insidious onset. He describes pain which is sometimes 8 out of 10 severity left lumbar back with radiation posterior lateral toward the knee. Associated numbness involving the lateral aspect the left knee extending down to the ankle. No associated weakness. Has taken ibuprofen without much improvement. Symptoms have been progressive over the past few weeks. Symptoms are worse sitting and somewhat improved with standing and walking. No urine or stool incontinence. No appetite or weight changes.  Past Medical History  Diagnosis Date  . Chicken pox   . Heart murmur   . Eczema   . Blepharitis   . MVP (mitral valve prolapse)     on echo about 10 years ago  2002  . Shoulder bursitis, right     chronic  . Chronic lumbar pain   . Colitis, indeterminate   . Crohn's disease ([place])   . Complication of anesthesia     eye surgery difficulty waking up   . Migraines     hx as child   . Neuritis of upper extremity     right upper extremities  . Arthritis   . Kidney stones    Past Surgical History  Procedure Laterality Date  . Appendectomy    . Tonsilectomy, adenoidectomy, bilateral myringotomy and tubes    . Belpharoptosis repair    . Colonoscopy  06/29/2012    Procedure: COLONOSCOPY;  Surgeon: Jerene Bears, MD;  Location: [facility];  Service: Gastroenterology;  Laterality: N/A;  . No past surgeries      no myringotomy/tubes  . Shoulder surgery      left  . Shoulder arthroscopy with subacromial decompression Right 06/17/2013    Procedure: RIGHT SHOULDER ARTHROSCOPY WITH SUBACROMIAL DECOMPRESSION/DISTAL CLAVICLE RESECTION;  Surgeon: Marin Shutter, MD;  Location: [place] [facility];  Service: Orthopedics;  Laterality: Right;     reports that he has never smoked. He has never used smokeless tobacco. He reports that he drinks alcohol. He reports that he does not use illicit drugs. family history includes Atrial fibrillation in his father; Hyperlipidemia in his mother; Hypertension in his mother; Stroke in his father. Allergies  Allergen Reactions  . Contrast Media [Iodinated Diagnostic Agents]     As a child has reaction      Review of Systems  Constitutional: Negative for fever, activity change and appetite change.  Respiratory: Negative for cough and shortness of breath.   Cardiovascular: Negative for chest pain and leg swelling.  Gastrointestinal: Negative for abdominal pain.  Endocrine: Negative for polydipsia and polyuria.  Genitourinary: Negative for dysuria, hematuria and flank pain.  Musculoskeletal: Positive for back pain. Negative for joint swelling.  Neurological: Positive for numbness. Negative for weakness.       Objective:   Physical Exam  Constitutional: He appears well-developed and well-nourished.  Cardiovascular: Normal rate and regular rhythm.   Musculoskeletal: He exhibits no edema.  Straight leg raises are negative bilaterally. No leg edema  Neurological:  Deep tendon reflexes are trace to 1+ right knee and trace left knee. Slightly diminished left knee compared to right. 1+ ankle reflex bilaterally. Full-strength with plantar flexion, dorsiflexion, and knee extension bilaterally.          Assessment & Plan:  Patient  presents with 6-8 week history of progressive left lumbar back pain with left lumbar radiculopathy symptoms. He has associated numbness but no weakness. Pain symptoms have been progressive. He's had intolerance with oral steroids previously. Depo-Medrol 80 mg IM given. Touch base for follow-up in one week. May need further imaging with MRI if not improving. Follow-up sooner for any weakness or progressive pain.  Eulas Post MD [facility] at  [place] steroid injection (Depomedrol).  Pt did get some slight relief but still has significant pain with activity and sitting.  He has fairly clear left lumbar radiculopathy symptoms and will proceed with MRI lumbar spine to further assess.  Eulas Post MD [facility]  MRI-moderately large disc protrusion L3-L4 impinging left L4 nerve root. Patient notified. Will set up neurosurgical referral. His symptoms are essentially the same. Slightly improved following IM steroid but still having tremendous difficulties with sitting  Eulas Post MD [facility]

## 2016-01-10 NOTE — Patient Instructions (Signed)

## 2016-01-15 ENCOUNTER — Encounter: Payer: Self-pay | Admitting: Family Medicine

## 2016-01-15 NOTE — Addendum Note (Signed)
Addended by: Eulas Post on: 01/15/2016 12:08 PM   Modules accepted: Orders

## 2016-01-19 ENCOUNTER — Ambulatory Visit
Admission: RE | Admit: 2016-01-19 | Discharge: 2016-01-19 | Disposition: A | Discharge status: Home / Self Care | Payer: 59 | Source: Ambulatory Visit | Attending: Family Medicine | Admitting: Family Medicine

## 2016-01-19 DIAGNOSIS — M5116 Intervertebral disc disorders with radiculopathy, lumbar region: Secondary | ICD-10-CM | POA: Diagnosis not present

## 2016-01-19 DIAGNOSIS — M5416 Radiculopathy, lumbar region: Secondary | ICD-10-CM

## 2016-01-19 NOTE — Addendum Note (Signed)
Addended by: Eulas Post on: 01/19/2016 08:31 AM   Modules accepted: Orders

## 2016-01-22 DIAGNOSIS — M549 Dorsalgia, unspecified: Secondary | ICD-10-CM | POA: Diagnosis not present

## 2016-01-22 DIAGNOSIS — M4300 Spondylolysis, site unspecified: Secondary | ICD-10-CM | POA: Diagnosis not present

## 2016-01-22 DIAGNOSIS — M545 Low back pain: Secondary | ICD-10-CM | POA: Diagnosis not present

## 2016-01-24 ENCOUNTER — Other Ambulatory Visit: Payer: 59

## 2016-01-25 DIAGNOSIS — M4726 Other spondylosis with radiculopathy, lumbar region: Secondary | ICD-10-CM | POA: Diagnosis not present

## 2016-01-25 DIAGNOSIS — M5126 Other intervertebral disc displacement, lumbar region: Secondary | ICD-10-CM | POA: Diagnosis not present

## 2016-01-25 DIAGNOSIS — M4806 Spinal stenosis, lumbar region: Secondary | ICD-10-CM | POA: Diagnosis not present

## 2016-01-25 DIAGNOSIS — M5116 Intervertebral disc disorders with radiculopathy, lumbar region: Secondary | ICD-10-CM | POA: Diagnosis not present

## 2016-01-25 DIAGNOSIS — M5136 Other intervertebral disc degeneration, lumbar region: Secondary | ICD-10-CM | POA: Diagnosis not present

## 2016-01-25 MED FILL — OXYCODONE/APAP 5-325: 5-325 | 6 days supply | Qty: 60 | Fill #0

## 2016-01-25 MED FILL — tiZANidine HCL 2 MG TABS: 2 | 8 days supply | Qty: 60 | Fill #0

## 2016-02-12 MED FILL — TAMSULOSIN HCL 0.4 MG CAP: 0.4 | 30 days supply | Qty: 30 | Fill #1

## 2016-02-12 MED FILL — LIALDA 1.2 GM TABLET SA: 1.2 | 30 days supply | Qty: 60 | Fill #3

## 2016-02-12 MED FILL — KETOCONAZOLE 2% CREAM: 2 | 30 days supply | Qty: 60 | Fill #2

## 2016-02-12 MED FILL — RESTASIS 0.05% EYE EMULSION: 0.05 | 30 days supply | Qty: 60 | Fill #4

## 2016-04-15 MED FILL — RESTASIS 0.05% EYE EMULSION: 0.05 | 30 days supply | Qty: 60 | Fill #5

## 2016-04-24 ENCOUNTER — Other Ambulatory Visit: Payer: 59

## 2016-04-29 ENCOUNTER — Encounter: Payer: 59 | Admitting: Family Medicine

## 2016-04-29 ENCOUNTER — Other Ambulatory Visit (INDEPENDENT_AMBULATORY_CARE_PROVIDER_SITE_OTHER): Payer: 59

## 2016-04-29 DIAGNOSIS — Z0000 Encounter for general adult medical examination without abnormal findings: Secondary | ICD-10-CM

## 2016-04-29 LAB — CBC WITH DIFFERENTIAL/PLATELET
Basophils Absolute: 0 10*3/uL (ref 0.0–0.1)
Basophils Relative: 0.7 % (ref 0.0–3.0)
EOS PCT: 2.2 % (ref 0.0–5.0)
Eosinophils Absolute: 0.1 10*3/uL (ref 0.0–0.7)
HEMATOCRIT: 38.8 % — AB (ref 39.0–52.0)
Hemoglobin: 13.1 g/dL (ref 13.0–17.0)
LYMPHS ABS: 1.6 10*3/uL (ref 0.7–4.0)
Lymphocytes Relative: 23.7 % (ref 12.0–46.0)
MCHC: 33.9 g/dL (ref 30.0–36.0)
MCV: 94.3 fl (ref 78.0–100.0)
MONOS PCT: 7.7 % (ref 3.0–12.0)
Monocytes Absolute: 0.5 10*3/uL (ref 0.1–1.0)
NEUTROS PCT: 65.7 % (ref 43.0–77.0)
Neutro Abs: 4.3 10*3/uL (ref 1.4–7.7)
Platelets: 217 10*3/uL (ref 150.0–400.0)
RBC: 4.11 Mil/uL — AB (ref 4.22–5.81)
RDW: 14 % (ref 11.5–15.5)
WBC: 6.5 10*3/uL (ref 4.0–10.5)

## 2016-04-29 LAB — BASIC METABOLIC PANEL
BUN: 19 mg/dL (ref 6–23)
CO2: 30 meq/L (ref 19–32)
Calcium: 8.9 mg/dL (ref 8.4–10.5)
Chloride: 103 mEq/L (ref 96–112)
Creatinine, Ser: 0.91 mg/dL (ref 0.40–1.50)
GFR: 89.57 mL/min (ref >60.00)
GLUCOSE: 89 mg/dL (ref 70–99)
Potassium: 4.1 mEq/L (ref 3.5–5.1)
SODIUM: 140 meq/L (ref 135–145)

## 2016-04-29 LAB — LIPID PANEL
Cholesterol: 162 mg/dL (ref 0–200)
HDL: 56.6 mg/dL
LDL Cholesterol: 96 mg/dL (ref 0–99)
NonHDL: 105.04
Total CHOL/HDL Ratio: 3
Triglycerides: 46 mg/dL (ref 0.0–149.0)
VLDL: 9.2 mg/dL (ref 0.0–40.0)

## 2016-04-29 LAB — HEPATIC FUNCTION PANEL
ALT: 15 U/L (ref 0–53)
AST: 20 U/L (ref 0–37)
Albumin: 3.8 g/dL (ref 3.5–5.2)
Alkaline Phosphatase: 45 U/L (ref 39–117)
Bilirubin, Direct: 0.1 mg/dL (ref 0.0–0.3)
Total Bilirubin: 0.4 mg/dL (ref 0.2–1.2)
Total Protein: 6.6 g/dL (ref 6.0–8.3)

## 2016-04-29 LAB — TSH: TSH: 2.87 u[IU]/mL (ref 0.35–4.50)

## 2016-04-29 LAB — PSA: PSA: 3.58 ng/mL (ref 0.10–4.00)

## 2016-05-06 ENCOUNTER — Ambulatory Visit (INDEPENDENT_AMBULATORY_CARE_PROVIDER_SITE_OTHER): Payer: 59 | Admitting: Family Medicine

## 2016-05-06 ENCOUNTER — Encounter: Payer: Self-pay | Admitting: Family Medicine

## 2016-05-06 VITALS — BP 128/80 | HR 83 | Ht 74.0 in | Wt 155.0 lb

## 2016-05-06 DIAGNOSIS — R972 Elevated prostate specific antigen [PSA]: Secondary | ICD-10-CM | POA: Diagnosis not present

## 2016-05-06 DIAGNOSIS — L821 Other seborrheic keratosis: Secondary | ICD-10-CM | POA: Diagnosis not present

## 2016-05-06 DIAGNOSIS — Z0000 Encounter for general adult medical examination without abnormal findings: Secondary | ICD-10-CM

## 2016-05-06 DIAGNOSIS — Z23 Encounter for immunization: Secondary | ICD-10-CM | POA: Diagnosis not present

## 2016-05-06 MED ORDER — KETOCONAZOLE 2 % EX CREA: TOPICAL_CREAM | Freq: Every day | CUTANEOUS | 99 refills | Status: AC | PRN

## 2016-05-06 MED ORDER — LIALDA 1.2 G PO TBEC: 2.4000 g | DELAYED_RELEASE_TABLET | Freq: Every day | ORAL | 3 refills | Status: AC

## 2016-05-06 MED ORDER — CYCLOSPORINE 0.05 % OP EMUL: 1.0000 [drp] | Freq: Two times a day (BID) | OPHTHALMIC | 99 refills | Status: AC

## 2016-05-06 MED ORDER — TAMSULOSIN HCL 0.4 MG PO CAPS: 0.4000 mg | ORAL_CAPSULE | Freq: Every day | ORAL | 11 refills | Status: AC

## 2016-05-06 MED FILL — TAMSULOSIN HCL 0.4 MG CAP: 0.4 | 30 days supply | Qty: 30 | Fill #0

## 2016-05-06 MED FILL — MESALAMINE DR 1.2 GM TABLET: 1.2 | 30 days supply | Qty: 60 | Fill #0

## 2016-05-06 MED FILL — KETOCONAZOLE 2% CREAM: 2 | 20 days supply | Qty: 60 | Fill #0

## 2016-05-06 MED FILL — RESTASIS 0.05% EYE EMULSION: 0.05 | 30 days supply | Qty: 60 | Fill #0

## 2016-05-06 NOTE — Patient Instructions (Signed)
Let's plan follow up PSA in about 6 months.

## 2016-05-06 NOTE — Progress Notes (Signed)
Subjective:     Patient ID: Jared Doctor, MD, male   DOB: [birth year], 60 y.o.   MRN: [id number]  HPI Patient here for physical exam. He's been exercising regularly and generally feels well. He has past history of kidney stones, indeterminate colitis, and reported mitral valve prolapse. He had echocardiogram last year which did not show anything other than significant mitral regurg. Needs flu vaccine. Colonoscopy up-to-date.  Occasional slow urinary stream in the past and takes Flomax inconsistently. Takes Lialda somewhat inconsistently based on symptom flare. He's had recent irritated skin lesion left side of neck and requesting treatment. Sometimes rubs against his shirt collars  Past Medical History:  Diagnosis Date  . Arthritis   . Blepharitis   . Chicken pox   . Chronic lumbar pain   . Colitis, indeterminate   . Complication of anesthesia    eye surgery difficulty waking up   . Crohn's disease ([place])   . Eczema   . Heart murmur   . Kidney stones   . Migraines    hx as child   . MVP (mitral valve prolapse)    on echo about 10 years ago  2002  . Neuritis of upper extremity    right upper extremities  . Shoulder bursitis, right    chronic   Past Surgical History:  Procedure Laterality Date  . APPENDECTOMY    . BELPHAROPTOSIS REPAIR    . COLONOSCOPY  06/29/2012   Procedure: COLONOSCOPY;  Surgeon: Jerene Bears, MD;  Location: [facility];  Service: Gastroenterology;  Laterality: N/A;  . LUMBAR DISC SURGERY N/A   . NO PAST SURGERIES     no myringotomy/tubes  . SHOULDER ARTHROSCOPY WITH SUBACROMIAL DECOMPRESSION Right 06/17/2013   Procedure: RIGHT SHOULDER ARTHROSCOPY WITH SUBACROMIAL DECOMPRESSION/DISTAL CLAVICLE RESECTION;  Surgeon: Marin Shutter, MD;  Location: [place];  Service: Orthopedics;  Laterality: Right;  . SHOULDER SURGERY     left  . TONSILECTOMY, ADENOIDECTOMY, BILATERAL MYRINGOTOMY AND TUBES      reports that he has never smoked. He has never used smokeless  tobacco. He reports that he drinks alcohol. He reports that he does not use drugs. family history includes Atrial fibrillation in his father; Hyperlipidemia in his mother; Hypertension in his mother; Stroke in his father. Allergies  Allergen Reactions  . Contrast Media [Iodinated Diagnostic Agents]     As a child has reaction     Review of Systems  Constitutional: Negative for activity change, appetite change, fatigue, fever and unexpected weight change.  HENT: Negative for congestion, ear pain and trouble swallowing.   Eyes: Negative for pain and visual disturbance.  Respiratory: Negative for cough, shortness of breath and wheezing.   Cardiovascular: Negative for chest pain and palpitations.  Gastrointestinal: Negative for abdominal distention, abdominal pain, blood in stool, constipation, diarrhea, nausea, rectal pain and vomiting.  Genitourinary: Negative for dysuria, hematuria and testicular pain.  Musculoskeletal: Negative for arthralgias and joint swelling.  Skin: Negative for rash.  Neurological: Negative for dizziness, syncope and headaches.  Hematological: Negative for adenopathy.  Psychiatric/Behavioral: Negative for confusion and dysphoric mood.       Objective:   Physical Exam  Constitutional: He is oriented to person, place, and time. He appears well-developed and well-nourished. No distress.  HENT:  Head: Normocephalic and atraumatic.  Right Ear: External ear normal.  Left Ear: External ear normal.  Mouth/Throat: Oropharynx is clear and moist.  Eyes: Conjunctivae and EOM are normal. Pupils are equal, round, and reactive to light.  Neck: Normal range of motion. Neck supple. No thyromegaly present.  Cardiovascular: Normal rate, regular rhythm and normal heart sounds.   No murmur heard. Pulmonary/Chest: No respiratory distress. He has no wheezes. He has no rales.  Abdominal: Soft. Bowel sounds are normal. He exhibits no distension and no mass. There is no tenderness.  There is no rebound and no guarding.  Musculoskeletal: He exhibits no edema.  Lymphadenopathy:    He has no cervical adenopathy.  Neurological: He is alert and oriented to person, place, and time. He displays normal reflexes. No cranial nerve deficit.  Skin: No rash noted.  Left lower neck brownish moderate well-demarcated skin lesion consistent with seborrheic keratosis. This is about 4 mm diameter  Psychiatric: He has a normal mood and affect.       Assessment:     Physical exam. Labs reviewed. Patient has had increase in PSA compared with couple years ago but is relatively asymptomatic. Benign appearing seborrheic keratosis left side of neck    Plan:     -Flu vaccine given -Consider repeat PSA in 6 months -Discussed risk and benefits of cryotherapy left-sided neck lesion and patient consents. Touch base if this is not resolving over the next few weeks  Eulas Post MD [facility] at [facility]

## 2016-06-12 DIAGNOSIS — H5212 Myopia, left eye: Secondary | ICD-10-CM | POA: Diagnosis not present

## 2016-06-12 DIAGNOSIS — H25813 Combined forms of age-related cataract, bilateral: Secondary | ICD-10-CM | POA: Diagnosis not present

## 2016-06-12 DIAGNOSIS — H524 Presbyopia: Secondary | ICD-10-CM | POA: Diagnosis not present

## 2016-06-12 DIAGNOSIS — H52223 Regular astigmatism, bilateral: Secondary | ICD-10-CM | POA: Diagnosis not present

## 2016-06-16 ENCOUNTER — Telehealth: Payer: 59 | Admitting: Family

## 2016-06-16 DIAGNOSIS — J019 Acute sinusitis, unspecified: Secondary | ICD-10-CM | POA: Diagnosis not present

## 2016-06-16 MED ORDER — DOXYCYCLINE HYCLATE 100 MG PO TABS: 100.0000 mg | ORAL_TABLET | Freq: Two times a day (BID) | ORAL | 0 refills | Status: AC

## 2016-06-16 NOTE — Progress Notes (Signed)

## 2016-06-24 ENCOUNTER — Other Ambulatory Visit: Payer: Self-pay

## 2016-06-24 ENCOUNTER — Other Ambulatory Visit (INDEPENDENT_AMBULATORY_CARE_PROVIDER_SITE_OTHER): Payer: 59

## 2016-06-24 ENCOUNTER — Encounter: Payer: Self-pay | Admitting: Family Medicine

## 2016-06-24 DIAGNOSIS — Z0184 Encounter for antibody response examination: Secondary | ICD-10-CM

## 2016-06-24 DIAGNOSIS — R972 Elevated prostate specific antigen [PSA]: Secondary | ICD-10-CM | POA: Diagnosis not present

## 2016-06-24 LAB — PSA: PSA: 3.93 ng/mL (ref 0.10–4.00)

## 2016-06-24 MED FILL — KETOCONAZOLE 2% CREAM: 2 | 20 days supply | Qty: 60 | Fill #1

## 2016-06-24 MED FILL — TAMSULOSIN HCL 0.4 MG CAP: 0.4 | 30 days supply | Qty: 30 | Fill #1

## 2016-06-24 MED FILL — RESTASIS 0.05% EYE EMULSION: 0.05 | 30 days supply | Qty: 60 | Fill #1

## 2016-06-24 MED FILL — MESALAMINE DR 1.2 GM TABLET: 1.2 | 30 days supply | Qty: 60 | Fill #1

## 2016-06-25 ENCOUNTER — Encounter: Payer: Self-pay | Admitting: Family Medicine

## 2016-06-25 LAB — MEASLES/MUMPS/RUBELLA IMMUNITY
Mumps IgG: 51 AU/mL — ABNORMAL HIGH (ref <9.00)
Rubella: 10.5 Index — ABNORMAL HIGH (ref <0.90)

## 2016-06-25 LAB — HEPATITIS B SURFACE ANTIBODY,QUALITATIVE: HEP B S AB: NEGATIVE

## 2016-07-25 MED FILL — KETOCONAZOLE 2% CREAM: 2 | 20 days supply | Qty: 60 | Fill #2

## 2016-07-25 MED FILL — TAMSULOSIN HCL 0.4 MG CAP: 0.4 | 30 days supply | Qty: 30 | Fill #2

## 2016-07-25 MED FILL — RESTASIS 0.05% EYE EMULSION: 0.05 | 30 days supply | Qty: 60 | Fill #2

## 2017-01-04 ENCOUNTER — Telehealth: Payer: 59 | Admitting: Nurse Practitioner

## 2017-01-04 DIAGNOSIS — R21 Rash and other nonspecific skin eruption: Secondary | ICD-10-CM | POA: Diagnosis not present

## 2017-01-04 MED ORDER — CEPHALEXIN 500 MG PO CAPS: 500.0000 mg | ORAL_CAPSULE | Freq: Two times a day (BID) | ORAL | 0 refills | Status: DC

## 2017-01-04 NOTE — Progress Notes (Signed)
E Visit for Rash  We are sorry that you are not feeling well. Here is how we plan to help!      Keflex 500 mg three times per day for 7 days       HOME CARE:   Take cool showers and avoid direct sunlight.  Apply cool compress or wet dressings.  Take a bath in an oatmeal bath.  Sprinkle content of one Aveeno packet under running faucet with comfortably warm water.  Bathe for 15-20 minutes, 1-2 times daily.  Pat dry with a towel. Do not rub the rash.  Use hydrocortisone cream.  Take an antihistamine like Benadryl for widespread rashes that itch.  The adult dose of Benadryl is 25-50 mg by mouth 4 times daily.  Caution:  This type of medication may cause sleepiness.  Do not drink alcohol, drive, or operate dangerous machinery while taking antihistamines.  Do not take these medications if you have prostate enlargement.  Read package instructions thoroughly on all medications that you take.  GET HELP RIGHT AWAY IF:   Symptoms don't go away after treatment.  Severe itching that persists.  If you rash spreads or swells.  If you rash begins to smell.  If it blisters and opens or develops a yellow-brown crust.  You develop a fever.  You have a sore throat.  You become short of breath.  MAKE SURE YOU:  Understand these instructions. Will watch your condition. Will get help right away if you are not doing well or get worse.  Thank you for choosing an e-visit. Your e-visit answers were reviewed by a board certified advanced clinical practitioner to complete your personal care plan. Depending upon the condition, your plan could have included both over the counter or prescription medications. Please review your pharmacy choice. Be sure that the pharmacy you have chosen is open so that you can pick up your prescription now.  If there is a problem you may message your provider in [place] to have the prescription routed to another pharmacy. Your safety is important to [place]. If  you have drug allergies check your prescription carefully.  For the next 24 hours, you can use MyChart to ask questions about today's visit, request a non-urgent call back, or ask for a work or school excuse from your e-visit provider. You will get an email in the next two days asking about your experience. I hope that your e-visit has been valuable and will speed your recovery.

## 2017-01-06 ENCOUNTER — Encounter: Payer: Self-pay | Admitting: Family Medicine

## 2017-01-06 ENCOUNTER — Other Ambulatory Visit: Payer: Self-pay

## 2017-01-06 MED ORDER — CEPHALEXIN 500 MG PO CAPS: 500.0000 mg | ORAL_CAPSULE | Freq: Two times a day (BID) | ORAL | 0 refills | Status: AC

## 2017-04-24 ENCOUNTER — Encounter: Payer: Self-pay | Admitting: Family Medicine

## 2017-09-21 IMAGING — MR MR LUMBAR SPINE W/O CM
4 of 5 series · 20 of 48 positions shown · non-contrast
Comparison: None.

CLINICAL DATA: Left lumbar radiculopathy

EXAM:
MRI LUMBAR SPINE WITHOUT CONTRAST
TECHNIQUE: Multiplanar, multisequence MR imaging of the lumbar spine was
performed. No intravenous contrast was administered.

[Series 7: T1 · sagittal · 4.0mm · 0.73mm/px · 3 of 15 slices shown (1 of 2)]
[im 3/15]
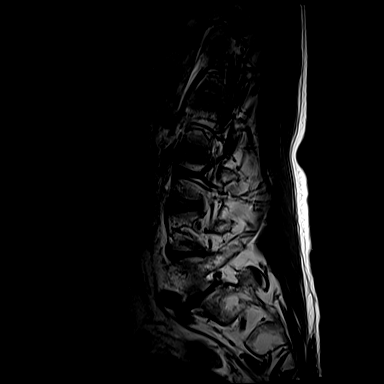
[im 9/15]
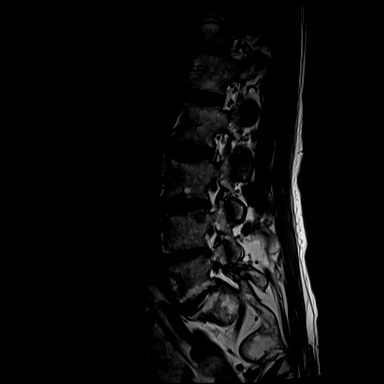
[im 15/15]
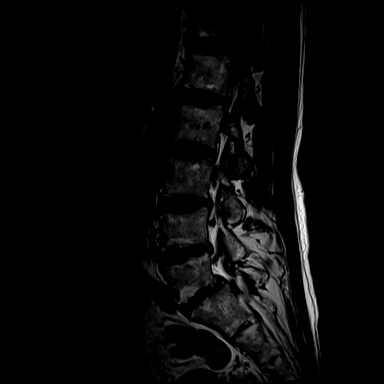

[Series 8: T2 · sagittal · 4.0mm · 0.73mm/px · 6 of 15 slices shown (1 of 2)]
[im 1/15]
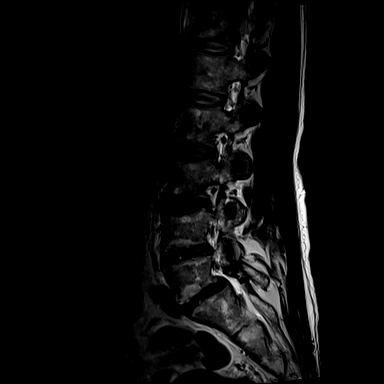
[im 3/15]
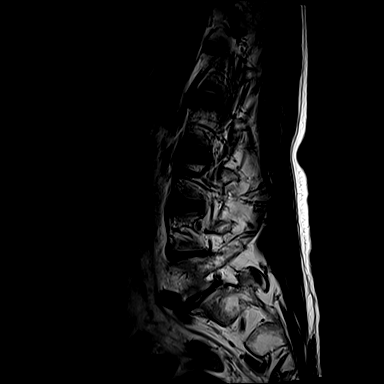
[im 6/15]
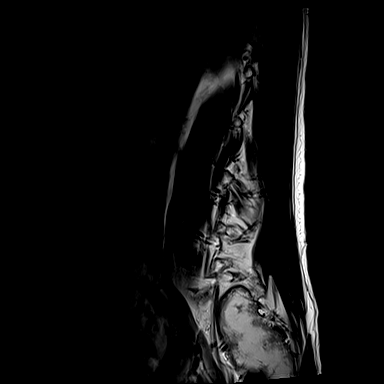
[im 9/15]
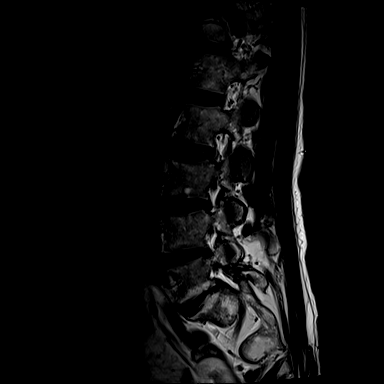
[im 12/15]
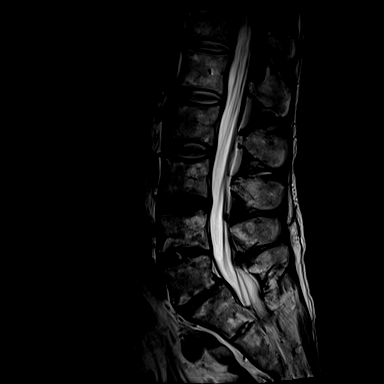
[im 15/15]
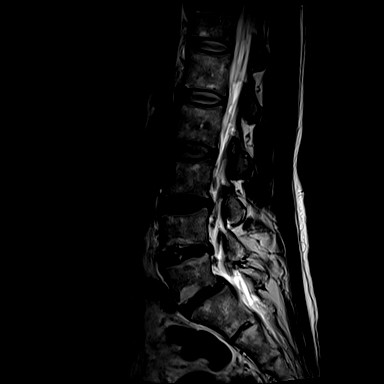

[Series 9: T1 · axial · 4.0mm · 0.56mm/px · z∈[+69,+208]mm · 3 of 38 slices shown (2 of 2)]
[im 6/38]
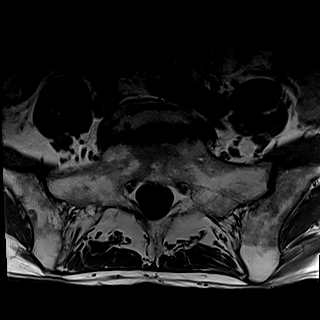
[im 19/38]
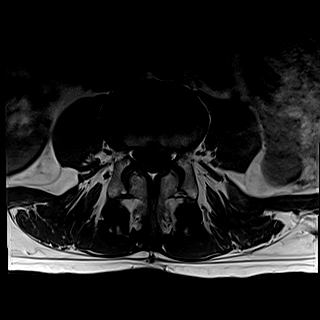
[im 32/38]
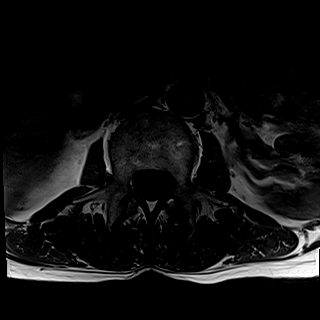

[Series 10: T2 · axial · 4.0mm · 0.28mm/px · z∈[+44,+208]mm · 8 of 38 slices shown (2 of 2)]
[im 1/38]
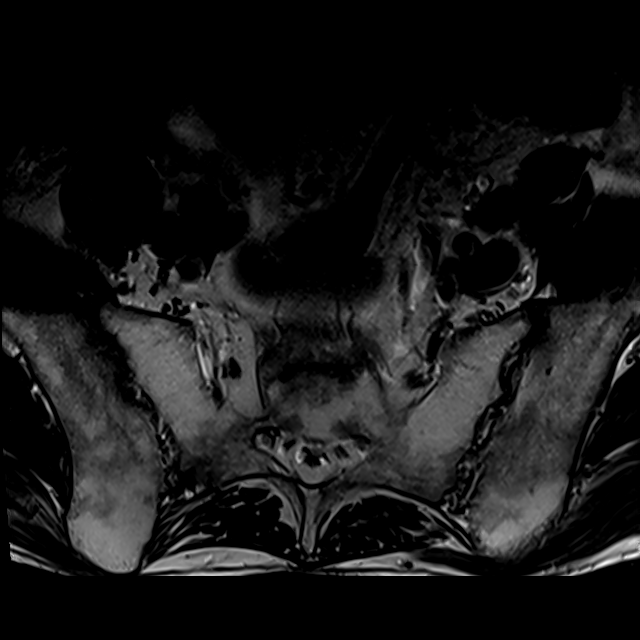
[im 6/38]
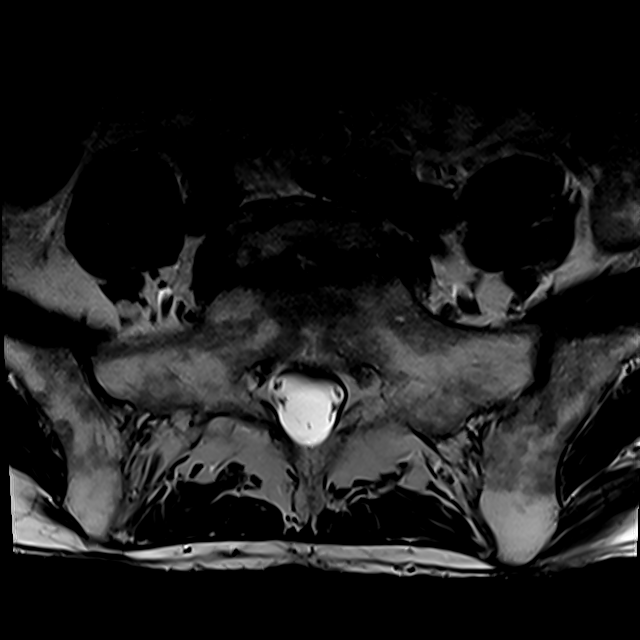
[im 11/38]
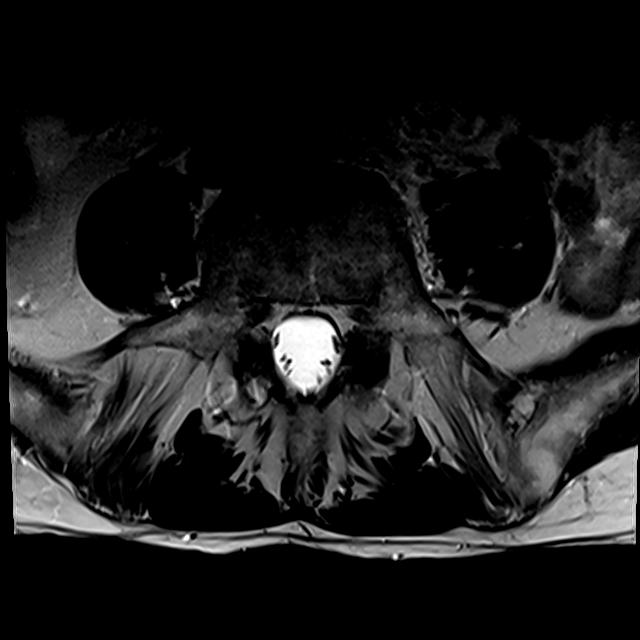
[im 16/38]
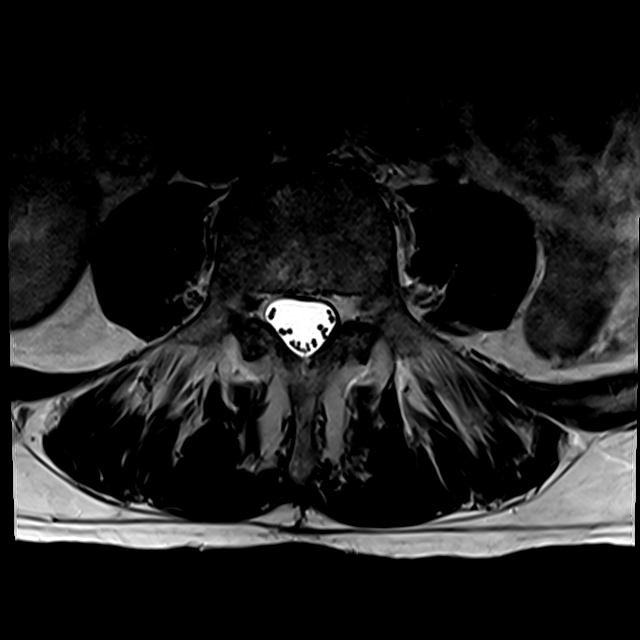
[im 19/38]
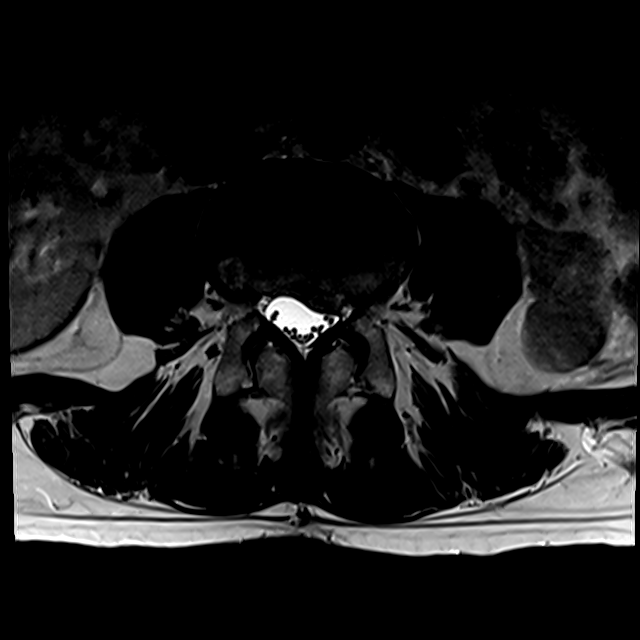
[im 22/38]
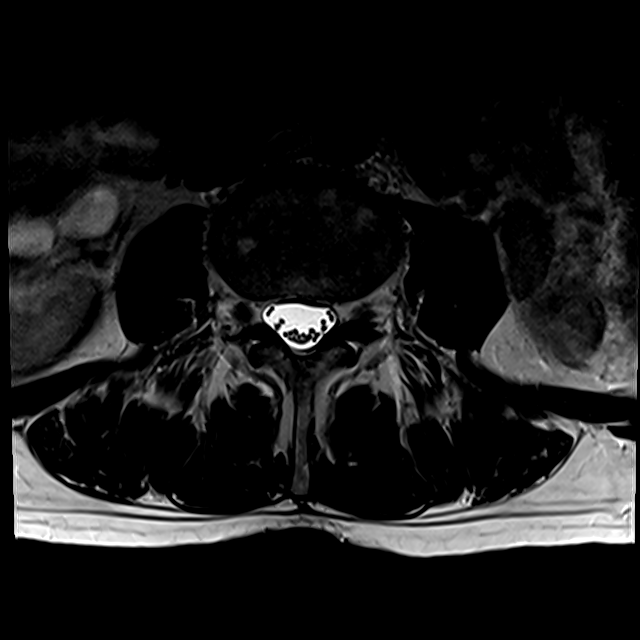
[im 27/38]
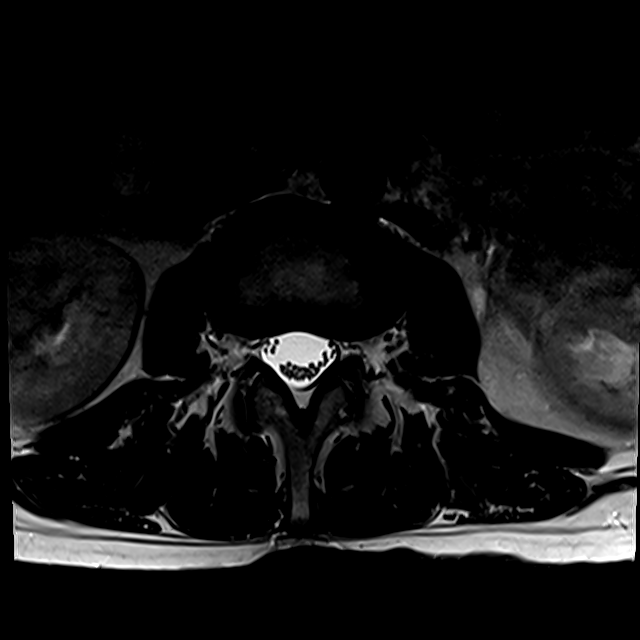
[im 32/38]
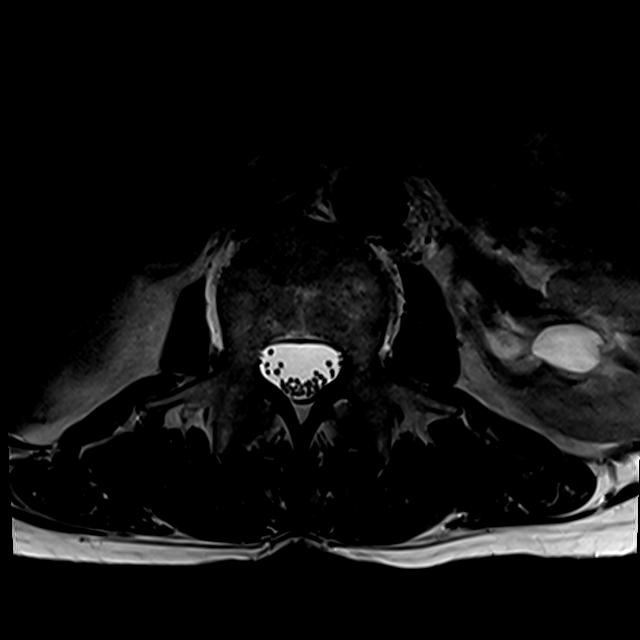

[20 of 48 positions shown; findings below may reference images not displayed]

FINDINGS: Segmentation:  Normal segmentation.  Lowest disc space L5-S1

Alignment: Grade 1 anterior listhesis L5-S1. Remaining alignment
normal.

Vertebrae: Negative for fracture or mass lesion. Normal bone marrow.

Conus medullaris: Extends to the L1 level and appears normal.

Paraspinal and other soft tissues: Paraspinous muscles are symmetric
and normal. Left parapelvic cyst. No retroperitoneal mass or
adenopathy. Abdominal aorta normal in caliber.

Disc levels:

L1-2:  Negative

L2-3:  Negative

L3-4: Moderately large left-sided disc protrusion with downgoing
disc material and impingement of the left L4 nerve root in the
subarticular zone. No significant spinal stenosis. Mild facet
degeneration. Mild disc bulging diffusely.

L4-5: Disc degeneration with disc space narrowing and endplate
degenerative changes. Mild endplate spurring and mild facet
degeneration. No significant spinal or foraminal stenosis

L5-S1: 5 mm anterior listhesis. Bilateral pars defects of L5 . No
significant spinal or foraminal stenosis
IMPRESSION: Moderately large left-sided disc protrusion L3-4 with downgoing disc
material and impingement of the left L4 nerve root

Disc degeneration and mild spurring L4-5 without stenosis

5 mm anterior listhesis L5-S1 with bilateral pars defect L5.
